# Patient Record
Sex: Male | Born: 1970 | Race: White | Hispanic: No | Marital: Married | Smoking: Former smoker
Health system: Southern US, Community
[De-identification: ages and names within clinical notes are randomized; demographics above are authoritative.]

## PROBLEM LIST (undated history)

## (undated) DIAGNOSIS — I10 Essential (primary) hypertension: Secondary | ICD-10-CM

---

## 2019-01-18 ENCOUNTER — Other Ambulatory Visit: Payer: Self-pay

## 2019-01-18 ENCOUNTER — Encounter: Payer: Self-pay | Admitting: Emergency Medicine

## 2019-01-18 ENCOUNTER — Emergency Department: Payer: No Typology Code available for payment source

## 2019-01-18 DIAGNOSIS — Z20828 Contact with and (suspected) exposure to other viral communicable diseases: Secondary | ICD-10-CM | POA: Diagnosis present

## 2019-01-18 DIAGNOSIS — K219 Gastro-esophageal reflux disease without esophagitis: Secondary | ICD-10-CM | POA: Diagnosis present

## 2019-01-18 DIAGNOSIS — I10 Essential (primary) hypertension: Secondary | ICD-10-CM | POA: Diagnosis present

## 2019-01-18 DIAGNOSIS — E871 Hypo-osmolality and hyponatremia: Secondary | ICD-10-CM | POA: Diagnosis present

## 2019-01-18 DIAGNOSIS — L309 Dermatitis, unspecified: Secondary | ICD-10-CM | POA: Diagnosis present

## 2019-01-18 DIAGNOSIS — E039 Hypothyroidism, unspecified: Secondary | ICD-10-CM | POA: Diagnosis present

## 2019-01-18 DIAGNOSIS — N179 Acute kidney failure, unspecified: Secondary | ICD-10-CM | POA: Diagnosis present

## 2019-01-18 DIAGNOSIS — F1010 Alcohol abuse, uncomplicated: Secondary | ICD-10-CM | POA: Diagnosis present

## 2019-01-18 DIAGNOSIS — L03114 Cellulitis of left upper limb: Secondary | ICD-10-CM | POA: Diagnosis not present

## 2019-01-18 DIAGNOSIS — Z79890 Hormone replacement therapy (postmenopausal): Secondary | ICD-10-CM

## 2019-01-18 DIAGNOSIS — B429 Sporotrichosis, unspecified: Secondary | ICD-10-CM | POA: Diagnosis not present

## 2019-01-18 DIAGNOSIS — Z87891 Personal history of nicotine dependence: Secondary | ICD-10-CM

## 2019-01-18 DIAGNOSIS — E878 Other disorders of electrolyte and fluid balance, not elsewhere classified: Secondary | ICD-10-CM | POA: Diagnosis present

## 2019-01-18 DIAGNOSIS — Z981 Arthrodesis status: Secondary | ICD-10-CM

## 2019-01-18 LAB — CBC WITH DIFFERENTIAL/PLATELET
Abs Immature Granulocytes: 0.02 10*3/uL (ref 0.00–0.07)
Basophils Absolute: 0.1 10*3/uL (ref 0.0–0.1)
Basophils Relative: 1 %
Eosinophils Absolute: 0.2 10*3/uL (ref 0.0–0.5)
Eosinophils Relative: 3 %
HCT: 43.8 % (ref 39.0–52.0)
Hemoglobin: 15.1 g/dL (ref 13.0–17.0)
Immature Granulocytes: 0 %
Lymphocytes Relative: 13 %
Lymphs Abs: 0.9 10*3/uL (ref 0.7–4.0)
MCH: 33.3 pg (ref 26.0–34.0)
MCHC: 34.5 g/dL (ref 30.0–36.0)
MCV: 96.5 fL (ref 80.0–100.0)
Monocytes Absolute: 0.6 10*3/uL (ref 0.1–1.0)
Monocytes Relative: 8 %
Neutro Abs: 5 10*3/uL (ref 1.7–7.7)
Neutrophils Relative %: 75 %
Platelets: 180 10*3/uL (ref 150–400)
RBC: 4.54 MIL/uL (ref 4.22–5.81)
RDW: 11.7 % (ref 11.5–15.5)
WBC: 6.8 10*3/uL (ref 4.0–10.5)
nRBC: 0 % (ref 0.0–0.2)

## 2019-01-18 LAB — COMPREHENSIVE METABOLIC PANEL
ALT: 54 U/L — ABNORMAL HIGH (ref 0–44)
AST: 35 U/L (ref 15–41)
Albumin: 4.2 g/dL (ref 3.5–5.0)
Alkaline Phosphatase: 38 U/L (ref 38–126)
Anion gap: 12 (ref 5–15)
BUN: 28 mg/dL — ABNORMAL HIGH (ref 6–20)
CO2: 21 mmol/L — ABNORMAL LOW (ref 22–32)
Calcium: 8.6 mg/dL — ABNORMAL LOW (ref 8.9–10.3)
Chloride: 96 mmol/L — ABNORMAL LOW (ref 98–111)
Creatinine, Ser: 1.47 mg/dL — ABNORMAL HIGH (ref 0.61–1.24)
GFR calc Af Amer: >60 mL/min (ref >60)
GFR calc non Af Amer: 55 mL/min — ABNORMAL LOW (ref >60)
Glucose, Bld: 95 mg/dL (ref 70–99)
Potassium: 4.3 mmol/L (ref 3.5–5.1)
Sodium: 129 mmol/L — ABNORMAL LOW (ref 135–145)
Total Bilirubin: 1.1 mg/dL (ref 0.3–1.2)
Total Protein: 7.1 g/dL (ref 6.5–8.1)

## 2019-01-18 LAB — LACTIC ACID, PLASMA: Lactic Acid, Venous: 1.2 mmol/L (ref 0.5–1.9)

## 2019-01-18 NOTE — ED Triage Notes (Signed)
Pt arrived via POV with reports of cutting trees down on Wednesday afternoon, states he got briars stuck in left hand, pt states over the past day he has noticed redness to left hand and open sores near left pinky and left thumb. Pt states pus was draining as well. Swelling also noted to left hand.

## 2019-01-19 ENCOUNTER — Inpatient Hospital Stay: Payer: No Typology Code available for payment source

## 2019-01-19 ENCOUNTER — Inpatient Hospital Stay
Admission: EM | Admit: 2019-01-19 | Discharge: 2019-01-20 | DRG: 603 | Disposition: A | Discharge status: Home / Self Care | Payer: No Typology Code available for payment source | Attending: Internal Medicine | Admitting: Internal Medicine

## 2019-01-19 DIAGNOSIS — E039 Hypothyroidism, unspecified: Secondary | ICD-10-CM | POA: Diagnosis present

## 2019-01-19 DIAGNOSIS — K219 Gastro-esophageal reflux disease without esophagitis: Secondary | ICD-10-CM

## 2019-01-19 DIAGNOSIS — Z79899 Other long term (current) drug therapy: Secondary | ICD-10-CM

## 2019-01-19 DIAGNOSIS — B4289 Other forms of sporotrichosis: Secondary | ICD-10-CM

## 2019-01-19 DIAGNOSIS — F1010 Alcohol abuse, uncomplicated: Secondary | ICD-10-CM

## 2019-01-19 DIAGNOSIS — B9561 Methicillin susceptible Staphylococcus aureus infection as the cause of diseases classified elsewhere: Secondary | ICD-10-CM | POA: Diagnosis not present

## 2019-01-19 DIAGNOSIS — Z87891 Personal history of nicotine dependence: Secondary | ICD-10-CM | POA: Diagnosis not present

## 2019-01-19 DIAGNOSIS — E878 Other disorders of electrolyte and fluid balance, not elsewhere classified: Secondary | ICD-10-CM | POA: Diagnosis present

## 2019-01-19 DIAGNOSIS — E871 Hypo-osmolality and hyponatremia: Secondary | ICD-10-CM | POA: Diagnosis present

## 2019-01-19 DIAGNOSIS — Z79890 Hormone replacement therapy (postmenopausal): Secondary | ICD-10-CM | POA: Diagnosis not present

## 2019-01-19 DIAGNOSIS — Z981 Arthrodesis status: Secondary | ICD-10-CM | POA: Diagnosis not present

## 2019-01-19 DIAGNOSIS — I10 Essential (primary) hypertension: Secondary | ICD-10-CM

## 2019-01-19 DIAGNOSIS — L03114 Cellulitis of left upper limb: Secondary | ICD-10-CM

## 2019-01-19 DIAGNOSIS — N179 Acute kidney failure, unspecified: Secondary | ICD-10-CM | POA: Diagnosis present

## 2019-01-19 DIAGNOSIS — B429 Sporotrichosis, unspecified: Secondary | ICD-10-CM

## 2019-01-19 DIAGNOSIS — L309 Dermatitis, unspecified: Secondary | ICD-10-CM | POA: Diagnosis present

## 2019-01-19 DIAGNOSIS — D72819 Decreased white blood cell count, unspecified: Secondary | ICD-10-CM | POA: Diagnosis not present

## 2019-01-19 DIAGNOSIS — Z20828 Contact with and (suspected) exposure to other viral communicable diseases: Secondary | ICD-10-CM | POA: Diagnosis present

## 2019-01-19 HISTORY — DX: Essential (primary) hypertension: I10

## 2019-01-19 LAB — SARS CORONAVIRUS 2 (TAT 6-24 HRS): SARS Coronavirus 2: NEGATIVE

## 2019-01-19 LAB — CBC
HCT: 41.6 % (ref 39.0–52.0)
Hemoglobin: 14.9 g/dL (ref 13.0–17.0)
MCH: 33.4 pg (ref 26.0–34.0)
MCHC: 35.8 g/dL (ref 30.0–36.0)
MCV: 93.3 fL (ref 80.0–100.0)
Platelets: 178 10*3/uL (ref 150–400)
RBC: 4.46 MIL/uL (ref 4.22–5.81)
RDW: 11.7 % (ref 11.5–15.5)
WBC: 5.5 10*3/uL (ref 4.0–10.5)
nRBC: 0 % (ref 0.0–0.2)

## 2019-01-19 LAB — BASIC METABOLIC PANEL
Anion gap: 9 (ref 5–15)
BUN: 24 mg/dL — ABNORMAL HIGH (ref 6–20)
CO2: 23 mmol/L (ref 22–32)
Calcium: 8.5 mg/dL — ABNORMAL LOW (ref 8.9–10.3)
Chloride: 105 mmol/L (ref 98–111)
Creatinine, Ser: 1.18 mg/dL (ref 0.61–1.24)
GFR calc Af Amer: >60 mL/min (ref >60)
GFR calc non Af Amer: >60 mL/min (ref >60)
Glucose, Bld: 119 mg/dL — ABNORMAL HIGH (ref 70–99)
Potassium: 3.9 mmol/L (ref 3.5–5.1)
Sodium: 137 mmol/L (ref 135–145)

## 2019-01-19 LAB — NA AND K (SODIUM & POTASSIUM), RAND UR
Potassium Urine: 10 mmol/L
Sodium, Ur: 27 mmol/L

## 2019-01-19 LAB — LACTIC ACID, PLASMA: Lactic Acid, Venous: 0.8 mmol/L (ref 0.5–1.9)

## 2019-01-19 LAB — HIV ANTIBODY (ROUTINE TESTING W REFLEX): HIV Screen 4th Generation wRfx: NONREACTIVE

## 2019-01-19 LAB — TSH: TSH: 3.435 u[IU]/mL (ref 0.350–4.500)

## 2019-01-19 LAB — OSMOLALITY: Osmolality: 293 mOsm/kg (ref 275–295)

## 2019-01-19 LAB — OSMOLALITY, URINE: Osmolality, Ur: 203 mOsm/kg — ABNORMAL LOW (ref 300–900)

## 2019-01-19 MED ORDER — VANCOMYCIN HCL IN DEXTROSE 750-5 MG/150ML-% IV SOLN
750.0000 mg | Freq: Two times a day (BID) | INTRAVENOUS | Status: DC
  Administered 2019-01-19 – 2019-01-20 (×2): 750 mg via INTRAVENOUS
  Filled 2019-01-19 (×3): qty 150

## 2019-01-19 MED ORDER — ONDANSETRON HCL 4 MG/2ML IJ SOLN: 4.0000 mg | Freq: Four times a day (QID) | INTRAMUSCULAR | Status: DC | PRN

## 2019-01-19 MED ORDER — POSACONAZOLE 100 MG PO TBEC
300.0000 mg | DELAYED_RELEASE_TABLET | Freq: Once | ORAL | Status: AC
  Administered 2019-01-19: 300 mg via ORAL
  Filled 2019-01-19: qty 3

## 2019-01-19 MED ORDER — VANCOMYCIN HCL IN DEXTROSE 1-5 GM/200ML-% IV SOLN
1000.0000 mg | Freq: Two times a day (BID) | INTRAVENOUS | Status: DC
  Filled 2019-01-19 (×2): qty 200

## 2019-01-19 MED ORDER — LEVOTHYROXINE SODIUM 50 MCG PO TABS
50.0000 ug | ORAL_TABLET | Freq: Every morning | ORAL | Status: DC
  Administered 2019-01-19 – 2019-01-20 (×2): 50 ug via ORAL
  Filled 2019-01-19 (×2): qty 1

## 2019-01-19 MED ORDER — VANCOMYCIN HCL 1.25 G IV SOLR
1250.0000 mg | Freq: Two times a day (BID) | INTRAVENOUS | Status: DC
  Filled 2019-01-19: qty 1250

## 2019-01-19 MED ORDER — LORAZEPAM 1 MG PO TABS: 1.0000 mg | ORAL_TABLET | Freq: Once | ORAL | Status: DC | PRN

## 2019-01-19 MED ORDER — SODIUM CHLORIDE 0.9 % IV SOLN
INTRAVENOUS | Status: DC
  Administered 2019-01-19 (×2): via INTRAVENOUS

## 2019-01-19 MED ORDER — SODIUM CHLORIDE 0.9 % IV SOLN
2.0000 g | Freq: Two times a day (BID) | INTRAVENOUS | Status: DC
  Administered 2019-01-19 – 2019-01-20 (×2): 2 g via INTRAVENOUS
  Filled 2019-01-19 (×3): qty 2

## 2019-01-19 MED ORDER — TERBINAFINE HCL 1 % EX CREA
TOPICAL_CREAM | Freq: Two times a day (BID) | CUTANEOUS | Status: DC
  Administered 2019-01-19: 09:00:00 via TOPICAL
  Filled 2019-01-19: qty 12

## 2019-01-19 MED ORDER — ONDANSETRON HCL 4 MG PO TABS: 4.0000 mg | ORAL_TABLET | Freq: Four times a day (QID) | ORAL | Status: DC | PRN

## 2019-01-19 MED ORDER — VANCOMYCIN HCL IN DEXTROSE 1-5 GM/200ML-% IV SOLN
1000.0000 mg | Freq: Once | INTRAVENOUS | Status: AC
  Administered 2019-01-19: 1000 mg via INTRAVENOUS
  Filled 2019-01-19: qty 200

## 2019-01-19 MED ORDER — SODIUM CHLORIDE 0.9 % IV BOLUS
1000.0000 mL | Freq: Once | INTRAVENOUS | Status: AC
  Administered 2019-01-19: 1000 mL via INTRAVENOUS

## 2019-01-19 MED ORDER — LABETALOL HCL 5 MG/ML IV SOLN
20.0000 mg | INTRAVENOUS | Status: DC | PRN
  Filled 2019-01-19: qty 4

## 2019-01-19 MED ORDER — ENOXAPARIN SODIUM 40 MG/0.4ML ~~LOC~~ SOLN
40.0000 mg | SUBCUTANEOUS | Status: DC
  Filled 2019-01-19 (×2): qty 0.4

## 2019-01-19 MED ORDER — VANCOMYCIN HCL IN DEXTROSE 1-5 GM/200ML-% IV SOLN: 1000.0000 mg | Freq: Once | INTRAVENOUS | Status: DC

## 2019-01-19 MED ORDER — MAGNESIUM HYDROXIDE 400 MG/5ML PO SUSP
30.0000 mL | Freq: Every day | ORAL | Status: DC | PRN
  Filled 2019-01-19: qty 30

## 2019-01-19 MED ORDER — ACETAMINOPHEN 650 MG RE SUPP: 650.0000 mg | Freq: Four times a day (QID) | RECTAL | Status: DC | PRN

## 2019-01-19 MED ORDER — ITRACONAZOLE 100 MG PO CAPS: 200.0000 mg | ORAL_CAPSULE | Freq: Once | ORAL | Status: DC

## 2019-01-19 MED ORDER — VANCOMYCIN HCL 1.25 G IV SOLR
1250.0000 mg | Freq: Once | INTRAVENOUS | Status: AC
  Administered 2019-01-19: 1250 mg via INTRAVENOUS
  Filled 2019-01-19: qty 1250

## 2019-01-19 MED ORDER — GADOBUTROL 1 MMOL/ML IV SOLN
10.0000 mL | Freq: Once | INTRAVENOUS | Status: AC | PRN
  Administered 2019-01-19: 10 mL via INTRAVENOUS

## 2019-01-19 MED ORDER — ACETAMINOPHEN 325 MG PO TABS: 650.0000 mg | ORAL_TABLET | Freq: Four times a day (QID) | ORAL | Status: DC | PRN

## 2019-01-19 MED ORDER — TRAZODONE HCL 50 MG PO TABS
25.0000 mg | ORAL_TABLET | Freq: Every evening | ORAL | Status: DC | PRN
  Administered 2019-01-19: 25 mg via ORAL
  Filled 2019-01-19: qty 1

## 2019-01-19 MED ORDER — FAMOTIDINE 20 MG PO TABS
20.0000 mg | ORAL_TABLET | Freq: Two times a day (BID) | ORAL | Status: DC | PRN
  Administered 2019-01-19 (×2): 20 mg via ORAL
  Filled 2019-01-19 (×2): qty 1

## 2019-01-19 MED ORDER — SODIUM CHLORIDE 0.9 % IV SOLN
1.0000 g | Freq: Once | INTRAVENOUS | Status: AC
  Administered 2019-01-19: 1 g via INTRAVENOUS
  Filled 2019-01-19: qty 1

## 2019-01-19 MED ORDER — POSACONAZOLE 100 MG PO TBEC
300.0000 mg | DELAYED_RELEASE_TABLET | Freq: Two times a day (BID) | ORAL | Status: DC
  Filled 2019-01-19 (×2): qty 3

## 2019-01-19 MED ORDER — DIPHENHYDRAMINE HCL 25 MG PO CAPS: 25.0000 mg | ORAL_CAPSULE | ORAL | Status: DC | PRN

## 2019-01-19 MED ORDER — LISINOPRIL 10 MG PO TABS: 20.0000 mg | ORAL_TABLET | Freq: Two times a day (BID) | ORAL | Status: DC

## 2019-01-19 NOTE — ED Provider Notes (Signed)
[facility] Emergency Department Provider Note  ____________________________________________  Time seen: Approximately 2:39 AM  I have reviewed the triage vital signs and the nursing notes.   HISTORY  Chief Complaint Hand Pain   HPI Kirk Landry is a 48 y.o. male with history of hypertension who presents for evaluation of hand infection.  Patient reports that 3 days ago he was cleaning out some trees and bushes with thorns in them. He got pricked on both hands several times.  Yesterday started noticing redness, warmth, pain, and swelling of his left hand.   Pain is mild.  No fever, no chills, no nausea, no vomiting.  Patient denies any history of immune suppression however he does report drinking 5 rum and coke drinks a day.  Patient is right-handed.  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  Past Medical History:  Diagnosis Date  . Hypertension      Prior to Admission medications   Medication Sig Start Date End Date Taking? Authorizing Provider  famotidine (PEPCID) 20 MG tablet Take 20 mg by mouth 2 (two) times daily as needed. 12/09/18  Yes [provider]  levothyroxine (SYNTHROID) 50 MCG tablet Take 50 mcg by mouth every morning. 12/09/18  Yes [provider]  lisinopril (ZESTRIL) 20 MG tablet Take 20 mg by mouth 2 (two) times daily. 12/09/18  Yes [provider]  sildenafil (VIAGRA) 100 MG tablet Take 50 mg by mouth daily as needed. 01/12/19  Yes [provider]    Allergies Patient has no known allergies.  No family history on file.  Social History Social History   Tobacco Use  . Smoking status: Former Games developer  . Smokeless tobacco: Never Used  Substance Use Topics  . Alcohol use: Yes  . Drug use: Not on file    Review of Systems  Constitutional: Negative for fever. Eyes: Negative for visual changes. ENT: Negative for sore throat. Neck: No neck pain  Cardiovascular: Negative for  chest pain. Respiratory: Negative for shortness of breath. Gastrointestinal: Negative for abdominal pain, vomiting or diarrhea. Genitourinary: Negative for dysuria. Musculoskeletal: Negative for back pain. + L hand pain and swelling Skin: Negative for rash. Neurological: Negative for headaches, weakness or numbness. Psych: No SI or HI  ____________________________________________   PHYSICAL EXAM:  VITAL SIGNS: ED Triage Vitals  Enc Vitals Group     BP 01/18/19 2224 (!) 133/91     Pulse Rate 01/18/19 2224 74     Resp 01/18/19 2224 18     Temp 01/18/19 2224 99 F (37.2 C)     Temp Source 01/18/19 2224 Oral     SpO2 01/18/19 2224 97 %     Weight 01/18/19 2221 260 lb (117.9 kg)     Height 01/18/19 2221 6\' 5"  (1.956 m)     Head Circumference --      Peak Flow --      Pain Score 01/18/19 2220 2     Pain Loc --      Pain Edu? --      Excl. in GC? --     Constitutional: Alert and oriented. Well appearing and in no apparent distress. HEENT:      Head: Normocephalic and atraumatic.         Eyes: Conjunctivae are normal. Sclera is non-icteric.       Mouth/Throat: Mucous membranes are moist.       Neck: Supple with no signs of meningismus. Cardiovascular: Regular  rate and rhythm.  Respiratory: Normal respiratory effort.  Musculoskeletal: Dorsum of the left hand is swollen, erythematous and warm to the touch.  Patient has 3 ulcerated lesions with surrounding erythema, no drainage or pus.  The redness extends past the wrist.  No palpable axillary lymphadenopathy.  Patient is able to fully close and open his hands with no significant pain.  There is no signs of inflammation of the palmar aspect of the hand Neurologic: Normal speech and language. Face is symmetric. Moving all extremities. No gross focal neurologic deficits are appreciated. Skin: Skin is warm, dry and intact. No rash noted. Psychiatric: Mood and affect are normal. Speech and behavior are normal.   ____________________________________________   LABS (all labs ordered are listed, but only abnormal results are displayed)  Labs Reviewed  COMPREHENSIVE METABOLIC PANEL - Abnormal; Notable for the following components:      Result Value   Sodium 129 (*)    Chloride 96 (*)    CO2 21 (*)    BUN 28 (*)    Creatinine, Ser 1.47 (*)    Calcium 8.6 (*)    ALT 54 (*)    GFR calc non Af Amer 55 (*)    All other components within normal limits  CULTURE, BLOOD (ROUTINE X 2)  CULTURE, BLOOD (ROUTINE X 2)  LACTIC ACID, PLASMA  CBC WITH DIFFERENTIAL/PLATELET  LACTIC ACID, PLASMA  HIV ANTIBODY (ROUTINE TESTING W REFLEX)   ____________________________________________  EKG  none  ____________________________________________  RADIOLOGY  I have personally reviewed the images performed during this visit and I agree with the Radiologist's read.   Interpretation by Radiologist:  Dg Hand Complete Left  Result Date: 01/19/2019 CLINICAL DATA:  Initial evaluation for possible infection. EXAM: LEFT HAND - COMPLETE 3+ VIEW COMPARISON:  None. FINDINGS: No acute fracture or dislocation. No radiographic evidence for acute osteomyelitis. Joint spaces maintained. Mild diffuse soft tissue swelling about the hand. No subcutaneous emphysema or radiopaque foreign body. IMPRESSION: 1. Mild diffuse soft tissue swelling about the hand. No soft tissue emphysema or radiopaque foreign body. 2. No acute osseous abnormality. No radiographic evidence for acute osteomyelitis. Electronically Signed   By: Rise MuBenjamin  McClintock M.D.   On: 01/19/2019 00:25     ____________________________________________   PROCEDURES  Procedure(s) performed: None Procedures Critical Care performed:  None ____________________________________________   INITIAL IMPRESSION / ASSESSMENT AND PLAN / ED COURSE   48 y.o. male with history of hypertension who presents for evaluation of hand infection after cutting several trees and bushes  with thorns several days ago.  Presentation is concerning for sporotrichosis plus or minus overlying cellulitis.  No signs of sepsis.  Patient's labs showing hyponatremia with a sodium of 129 and mild AKI with creatinine of 1.47 and GFR 55.  No prior recent labs for comparison.  Most likely due to alcohol abuse.  Discussed with pharmacy and will start patient on posaconazole.  We will also cover with IV antibiotics with cefepime and vancomycin.  Will give fluids for hyponatremia and AKI.  Will demarcate the affected area.  Will admit to the hospitalist service for close monitoring       As part of my medical decision making, I reviewed the following data within the electronic MEDICAL RECORD NUMBER Nursing notes reviewed and incorporated, Labs reviewed , Old chart reviewed, Radiograph reviewed , Discussed with admitting physician , Notes from prior ED visits and [place] Controlled Substance Database   Patient was evaluated in Emergency Department today for the symptoms described  in the history of present illness. Patient was evaluated in the context of the global COVID-19 pandemic, which necessitated consideration that the patient might be at risk for infection with the SARS-CoV-2 virus that causes COVID-19. Institutional protocols and algorithms that pertain to the evaluation of patients at risk for COVID-19 are in a state of rapid change based on information released by regulatory bodies including the CDC and federal and state organizations. These policies and algorithms were followed during the patient's care in the ED.   ____________________________________________   FINAL CLINICAL IMPRESSION(S) / ED DIAGNOSES   Final diagnoses:  Cutaneous sporotrichosis  Cellulitis of left upper extremity  Hyponatremia  AKI (acute kidney injury) ([place])      NEW MEDICATIONS STARTED DURING THIS VISIT:  ED Discharge Orders    None       Note:  This document was prepared using Dragon voice recognition  software and may include unintentional dictation errors.    Alfred Levins, [place], MD 01/19/19 [phone]

## 2019-01-19 NOTE — ED Notes (Signed)
Patient moved to RM 35, introduced self to patient.  Voices no needs at this time.

## 2019-01-19 NOTE — Progress Notes (Signed)
PROGRESS NOTE  Kirk Landry DZH:[id number] DOB: [birth year] DOA: 01/19/2019 PCP: Patient, No Pcp Per  Brief History   Kirk Landry  is a 48 y.o. Caucasian male with a known history of hypertension, hypothyroidism and alcohol abuse, who presented to the emergency room with acute onset of worsening left hand swelling with associated mild pain and warmth with no recent fever or chills.  He stated that he was cleaning and cutting a tree and moving branches as well as cleaning bushes that have thorns on Wednesday and got pricked on both hands several times.  His left hand started swelling with associated redness and pain since yesterday.  He denied any nausea or vomiting or abdominal pain.  No chest pain or dyspnea or cough or wheezing or hemoptysis.  He drinks about 5 alcoholic drinks per day and his last one was with lunch yesterday.  Upon presentation to the emergency room, blood pressure was 133/91 with a pulse of 74 and temperature was 99.  Labs revealed hyponatremia of 129 and hypochloremia of 96 with elevated BUN of 28 and creatinine 1.47 and slightly elevated ALT of 54 with AST 35, lactic acid 1.2 L 0.8 and CBC that was unremarkable.  The patient was given IV cefepime and IV vancomycin as well as posaconazole. Terbenafine was given topically to lesions.  He was admitted to a medical bed for further evaluation and management. This morning I have consulted Dr. Delaine Lame for infectious disease. He has recommended MRI of the hand to rule out tenosynovitis or osteomyelitis.  Antifungals have been stopped at his request as well.  Consultants  . Infectious Disease  Procedures  . None  Antibiotics   Anti-infectives (From admission, onward)   Start     Dose/Rate Route Frequency Ordered Stop   01/19/19 1800  vancomycin (VANCOCIN) 1,250 mg in sodium chloride 0.9 % 250 mL IVPB     1,250 mg 166.7 mL/hr over 90 Minutes Intravenous Every 12 hours 01/19/19 1226     01/19/19 1700  vancomycin  (VANCOCIN) IVPB 1000 mg/200 mL premix  Status:  Discontinued     1,000 mg 133.3 mL/hr over 90 Minutes Intravenous Every 12 hours 01/19/19 0511 01/19/19 1226   01/19/19 1400  posaconazole (NOXAFIL) delayed-release tablet 300 mg  Status:  Discontinued     300 mg Oral 2 times daily 01/19/19 0437 01/19/19 1232   01/19/19 0500  vancomycin (VANCOCIN) 1,250 mg in sodium chloride 0.9 % 250 mL IVPB     1,250 mg 166.7 mL/hr over 90 Minutes Intravenous  Once 01/19/19 0457 01/19/19 0716   01/19/19 0445  vancomycin (VANCOCIN) IVPB 1000 mg/200 mL premix  Status:  Discontinued     1,000 mg 200 mL/hr over 60 Minutes Intravenous  Once 01/19/19 0437 01/19/19 0457   01/19/19 0245  posaconazole (NOXAFIL) delayed-release tablet 300 mg     300 mg Oral  Once 01/19/19 0235 01/19/19 0549   01/19/19 0230  itraconazole (SPORANOX) capsule 200 mg  Status:  Discontinued     200 mg Oral  Once 01/19/19 0223 01/19/19 0235   01/19/19 0230  ceFEPIme (MAXIPIME) 1 g in sodium chloride 0.9 % 100 mL IVPB     1 g 200 mL/hr over 30 Minutes Intravenous  Once 01/19/19 0223 01/19/19 0436   01/19/19 0230  vancomycin (VANCOCIN) IVPB 1000 mg/200 mL premix     1,000 mg 200 mL/hr over 60 Minutes Intravenous  Once 01/19/19 0223 01/19/19 0543    .  Subjective  The patient is resting  comfortably. He states that his hand is much less swollen and that he can open and close his hand more easily.  Objective   Vitals:  Vitals:   01/19/19 0847 01/19/19 1219  BP: 132/79 120/80  Pulse: (!) 59 (!) 58  Resp: 16 20  Temp: 98.1 F (36.7 C) 98 F (36.7 C)  SpO2: 100% 98%   Exam:  Constitutional:  . The patient is awake, alert, and oriented x 3. No acute distress. Respiratory:  . No increased work of breathing. . No wheezes, rales, or rhonchi . No tactile fremitus Cardiovascular:  . Regular rate and rhythm . No murmurs, ectopy, or gallups. . No lateral PMI. No thrills. Abdomen:  . Abdomen is soft, non-tender, non-distended . No  hernias, masses, or organomegaly . Normoactive bowel sounds.  Musculoskeletal:  . No cyanosis, clubbing, or edema . Left hand is erythematous and slightly swollen. Lesions on left lateral aspect of hand and near 1st PCP joints are noted and have circumferential erythema. They appear dry. Skin:  . No rashes, lesions, ulcers with exception for left hand as described above. . palpation of skin: no induration or nodules Neurologic:  . CN 2-12 intact . Sensation all 4 extremities intact Psychiatric:  . Mental status o Mood, affect appropriate o Orientation to person, place, time  . judgment and insight appear intact  I have personally reviewed the following:   Today's Data  . Vitals, BMP, CBC  Micro Data  . Blood cultures x 2 have had no growth. .   Scheduled Meds: . enoxaparin (LOVENOX) injection  40 mg Subcutaneous Q24H  . levothyroxine  50 mcg Oral q morning - 10a  . terbinafine   Topical BID   Continuous Infusions: . sodium chloride 100 mL/hr at 01/19/19 0716  . vancomycin      Active Problems:   Cellulitis of left hand   LOS: 0 days   A & P   Left hand cellulitis with likely associated lesions highly suspicious for sporotrichosis:  The patient has been admitted to a medical bed. The patient has been continued on vancomycin. Antifungals have been discontinued on the advise of Dr. Rivka Safer. MRI of hand has been ordered to rule out abscess, osteomyelitis or tenosynovitis. Blood cultures x 2 have had no growth.  Acute kidney injury: Improved. Zestril has been held and the patient received IV fluids overnight. Continue to monitor creatinine, electrolytes and volume status. Avoid nephrotoxic substances and hypotension.  Hyponatremia:  Resolved. This likely secondary to excess alcohol intake. I will relax fluid restrictions.  Alcohol abuse:  He will be placed on as needed IV Ativan and a banana bag daily.  Hypothyroidism:  We will continue Synthroid and check TSH  level.  Hypertension: We will hold off Zestril given acute kidney injury and place him on as needed IV labetalol.  GERD.  We will continue H2 blocker therapy.  I have seen and examined this patient myself. I have spent 35 minutes in his evaluation and care with more than 50% of this spent in coordination of care with Dr. Rivka Safer.  Karuna Balducci, DO [facility] Direct contact: see [email or web address]  7PM-7AM contact night coverage as above 01/19/2019, 12:33 PM  LOS: 0 days

## 2019-01-19 NOTE — H&P (Signed)
[place] [facility]   PATIENT NAME: Kirk ReichertRonald Landry    MR#:  [id number]  DATE OF BIRTH:  [birth year]  DATE OF ADMISSION:  01/19/2019  PRIMARY CARE PHYSICIAN: Patient, No Pcp Per   REQUESTING/REFERRING PHYSICIAN: Cecil CobbsVeronese, Caroline, MD  CHIEF COMPLAINT:   Chief Complaint  Patient presents with  . Hand Pain    HISTORY OF PRESENT ILLNESS:  Kirk ReichertRonald Ahles  is a 48 y.o. Caucasian male with a known history of hypertension, hypothyroidism and alcohol abuse, who presented to the emergency room with acute onset of worsening left hand swelling with associated mild pain and warmth with no recent fever or chills.  He stated that he was cleaning and cutting a tree and moving branches as well as cleaning bushes that have thorns on Wednesday and got pricked on both hands several times.  His left hand started swelling with associated redness and pain since yesterday.  He denied any nausea or vomiting or abdominal pain.  No chest pain or dyspnea or cough or wheezing or hemoptysis.  He drinks about 5 alcoholic drinks per day and his last one was with lunch yesterday.  Upon presentation to the emergency room, blood pressure was 133/91 with a pulse of 74 and temperature was 99.  Labs revealed hyponatremia of 129 and hypochloremia of 96 with elevated BUN of 28 and creatinine 1.47 and slightly elevated ALT of 54 with AST 35, lactic acid 1.2 L 0.8 and CBC that was unremarkable.  The patient was given IV cefepime and IV vancomycin as well as posaconazole.  He will be admitted to a medical bed for further evaluation and management  PAST MEDICAL HISTORY:   Past Medical History:  Diagnosis Date  . Hypertension     PAST SURGICAL HISTORY:  History reviewed. No pertinent surgical history.  SOCIAL HISTORY:   Social History   Tobacco Use  . Smoking status: Former Games developermoker  . Smokeless tobacco: Never Used  Substance Use Topics  . Alcohol use: Yes    FAMILY HISTORY:  No family history on  file.  DRUG ALLERGIES:  No Known Allergies  REVIEW OF SYSTEMS:   ROS As per history of present illness. All pertinent systems were reviewed above. Constitutional,  HEENT, cardiovascular, respiratory, GI, GU, musculoskeletal, neuro, psychiatric, endocrine,  integumentary and hematologic systems were reviewed and are otherwise  negative/unremarkable except for positive findings mentioned above in the HPI.   MEDICATIONS AT HOME:   Prior to Admission medications   Medication Sig Start Date End Date Taking? Authorizing Provider  famotidine (PEPCID) 20 MG tablet Take 20 mg by mouth 2 (two) times daily as needed. 12/09/18  Yes [provider]  levothyroxine (SYNTHROID) 50 MCG tablet Take 50 mcg by mouth every morning. 12/09/18  Yes [provider]  lisinopril (ZESTRIL) 20 MG tablet Take 20 mg by mouth 2 (two) times daily. 12/09/18  Yes [provider]  sildenafil (VIAGRA) 100 MG tablet Take 50 mg by mouth daily as needed. 01/12/19  Yes [provider]      VITAL SIGNS:  Blood pressure 121/77, pulse 63, temperature 99 F (37.2 C), temperature source Oral, resp. rate 18, height 6\' 5"  (1.956 m), weight 117.9 kg, SpO2 96 %.  PHYSICAL EXAMINATION:  Physical Exam  GENERAL:  48 y.o.-year-old well-developed well-nourished Caucasian male patient lying in the bed with no acute distress.  EYES: Pupils equal, round, reactive to light and accommodation. No scleral icterus. Extraocular muscles intact.  HEENT: Head atraumatic, normocephalic. Oropharynx  and nasopharynx clear.  NECK:  Supple, no jugular venous distention. No thyroid enlargement, no tenderness.  LUNGS: Normal breath sounds bilaterally, no wheezing, rales,rhonchi or crepitation. No use of accessory muscles of respiration.  CARDIOVASCULAR: Regular rate and rhythm, S1, S2 normal. No murmurs, rubs, or gallops.  ABDOMEN: Soft, nondistended, nontender. Bowel sounds present. No organomegaly or mass.   EXTREMITIES: No pedal edema, cyanosis, or clubbing.  NEUROLOGIC: Cranial nerves II through XII are intact. Muscle strength 5/5 in all extremities. Sensation intact. Gait not checked.  PSYCHIATRIC: The patient is alert and oriented x 3.  Normal affect and good eye contact. SKIN: Left dorsal hand swelling with erythema, warmth, mild induration and tenderness with oval eruptions/lesion with central hyperpigmentation however rather than hypopigmentation seen with ringworm and raised erythematous surrounding edge on the lateral side of the dorsum of the hand almost at the fifth carpometacarpal joint as well as dorsal side of the base of this thumb at the metacarpal phalangeal joint and a smaller one at the base of the index proximal to the second metacarpophalangeal joint.  Erythema is slightly extending to his left wrist and distal forearm    LABORATORY PANEL:   CBC Recent Labs  Lab 01/19/19 0542  WBC 5.5  HGB 14.9  HCT 41.6  PLT 178   ------------------------------------------------------------------------------------------------------------------  Chemistries  Recent Labs  Lab 01/18/19 2228  NA 129*  K 4.3  CL 96*  CO2 21*  GLUCOSE 95  BUN 28*  CREATININE 1.47*  CALCIUM 8.6*  AST 35  ALT 54*  ALKPHOS 38  BILITOT 1.1   ------------------------------------------------------------------------------------------------------------------  Cardiac Enzymes No results for input(s): TROPONINI in the last 168 hours. ------------------------------------------------------------------------------------------------------------------  RADIOLOGY:  Dg Hand Complete Left  Result Date: 01/19/2019 CLINICAL DATA:  Initial evaluation for possible infection. EXAM: LEFT HAND - COMPLETE 3+ VIEW COMPARISON:  None. FINDINGS: No acute fracture or dislocation. No radiographic evidence for acute osteomyelitis. Joint spaces maintained. Mild diffuse soft tissue swelling about the hand. No subcutaneous  emphysema or radiopaque foreign body. IMPRESSION: 1. Mild diffuse soft tissue swelling about the hand. No soft tissue emphysema or radiopaque foreign body. 2. No acute osseous abnormality. No radiographic evidence for acute osteomyelitis. Electronically Signed   By: Jeannine Boga M.D.   On: 01/19/2019 00:25      IMPRESSION AND PLAN:   1.  Left hand cellulitis with likely associated lesions highly suspicious for sporotrichosis.  The patient will be admitted to a medical bed.  We will continue antibiotic therapy with IV vancomycin given the severity of his cellulitis as well as continue p.o. posaconazole and add Lamisil cream to his lesions to cover fungal etiology associated with his lesions.  2.  Acute kidney injury.  We will hold off Zestril and hydrate with IV banana bag and follow BMP.  3.  Hyponatremia.  This likely secondary to excess alcohol intake.  He will be hydrated with IV normal saline and will limit p.o. fluids to 1200 mL/day.  Will check hyponatremia work-up.  4.  Alcohol abuse.  He will be placed on as needed IV Ativan and a banana bag daily.  5.  Hypothyroidism.  We will continue Synthroid and check TSH level.  6.  Hypertension.  We will hold off Zestril given acute kidney injury and place him on as needed IV labetalol.  7.  GERD.  We will continue H2 blocker therapy.  8.  DVT prophylaxis.  Subcutaneous Lovenox.  All the records are reviewed and case discussed with ED provider.  The plan of care was discussed in details with the patient (and family). I answered all questions. The patient agreed to proceed with the above mentioned plan. Further management will depend upon hospital course.   CODE STATUS: Full code  TOTAL TIME TAKING CARE OF THIS PATIENT: 55 minutes.    Hannah Beat M.D on 01/19/2019 at 6:20 AM  [facility]   From 7 PM-7 AM, contact night-coverage [email or web address]  CC: Primary care physician; Patient, No Pcp Per   Note: This dictation  was prepared with Dragon dictation along with smaller phrase technology. Any transcriptional errors that result from this process are unintentional.

## 2019-01-19 NOTE — ED Notes (Signed)
See triage note. Pt in with swelling and circular discolored spot to L hand after cutting down bamboo trees with briars. Thinks maybe he got one of the briars stuck in his hand.

## 2019-01-19 NOTE — Consult Note (Signed)
NAME: Kirk Landry  DOB: November 20, 1970  MRN: [id number]  Date/Time: 01/19/2019 12:39 PM  REQUESTING PROVIDER Dr. Benny Lennert Subjective:  REASON FOR CONSULT: Left hand infection ? Kirk Landry is a 48 y.o. male with a history of hypertension presented to the emergency department on 01/18/2019 with swelling of his left hand and redness and 2 areas that appear like a open wound. Patient does a lot of outdoor activities.  On Wednesday, 01/15/2019 he was cutting trees and was pulling some briars down when he got the prior stuck in his left hand.  There were 2 places where he thought the  needle went in. one was base of the thumb and the other was at the lateral margin of his left hand.  He took a needle from a sewing kit box and try to open up both the areas to remove the splinter.  The next day he noted that the area was getting a blister which was filled with blood.  He tried to squeeze it to open it.  He then used hydrogen peroxide and some neomycin and hydrocortisone.  Yesterday he started to notice that his hand was more swollen and red and his wife was concerned that he may have necrotizing fasciitis and he came to the emergency department.  He did not have any fever or chills.  He noticed that the swelling was going up his arm.  He did not have much pain.  He says his pain threshold is very high. Patient prior to that had injured his left index finger knuckle area from fishing and as per his wife it was swollen but the patient says it was not.  This was 3 weeks ago.  Then on October 30 he went hunting and killed a hog and skinned deep wound at the hog  He says he is always getting scratches and injuries from all his activities  In the ED his vitals where 99 of temperature respiratory rate of 18, pulse rate of 74 blood pressure of 133/91.  He got a dose of vancomycin and cefepime and also got a dose of posaconazole 300 mg tablet.   Past Medical History:  Diagnosis Date  . 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  Drinks rum  Social History   Socioeconomic History  . Marital status: Married    Spouse name: Not on file  . Number of children: Not on file  . Years of education: Not on file  . Highest education level: Not on file  Occupational History  . Not on file  Social Needs  . Financial resource strain: Not on file  . Food insecurity    Worry: Not on file    Inability: Not on file  . Transportation needs    Medical: Not on file    Non-medical: Not on file  Tobacco Use  . Smoking status: Former Research scientist (life sciences)  . Smokeless tobacco: Never Used  Substance and Sexual Activity  . Alcohol use: Yes  . Drug use: Not on file  . Sexual activity: Not on file  Lifestyle  . Physical activity    Days per week: Not on file    Minutes per session: Not on file  . Stress: Not on file  Relationships  . Social connections  Talks on phone: Not on file    Gets together: Not on file    Attends religious service: Not on file    Active member of club or organization: Not on file    Attends meetings of clubs or organizations: Not on file    Relationship status: Not on file  . Intimate partner violence    Fear of current or ex partner: Not on file    Emotionally abused: Not on file    Physically abused: Not on file    Forced sexual activity: Not on file  Other Topics Concern  . Not on file  Social History Narrative  . Not on file    FH- cancer father ? Current Facility-Administered Medications  Medication Dose Route Frequency Provider Last Rate Last Dose  . 0.9 %  sodium chloride infusion   Intravenous Continuous Mansy, Jan A, MD 100 mL/hr at 01/19/19 0716    . acetaminophen (TYLENOL) tablet 650 mg  650 mg Oral Q6H PRN Mansy, Jan A, MD       Or   . acetaminophen (TYLENOL) suppository 650 mg  650 mg Rectal Q6H PRN Mansy, Jan A, MD      . diphenhydrAMINE (BENADRYL) capsule 25 mg  25 mg Oral PRN Swayze, Ava, DO      . enoxaparin (LOVENOX) injection 40 mg  40 mg Subcutaneous Q24H Mansy, Jan A, MD      . famotidine (PEPCID) tablet 20 mg  20 mg Oral BID PRN Mansy, Jan A, MD   20 mg at 01/19/19 1204  . labetalol (NORMODYNE) injection 20 mg  20 mg Intravenous Q3H PRN Mansy, Jan A, MD      . levothyroxine (SYNTHROID) tablet 50 mcg  50 mcg Oral q morning - 10a Mansy, Jan A, MD   50 mcg at 01/19/19 0916  . LORazepam (ATIVAN) tablet 1 mg  1 mg Oral Once PRN Swayze, Ava, DO      . magnesium hydroxide (MILK OF MAGNESIA) suspension 30 mL  30 mL Oral Daily PRN Mansy, Jan A, MD      . ondansetron [facility]) tablet 4 mg  4 mg Oral Q6H PRN Mansy, Jan A, MD       Or  . ondansetron [facility]) injection 4 mg  4 mg Intravenous Q6H PRN Mansy, Jan A, MD      . traZODone (DESYREL) tablet 25 mg  25 mg Oral QHS PRN Mansy, Jan A, MD      . vancomycin (VANCOCIN) 1,250 mg in sodium chloride 0.9 % 250 mL IVPB  1,250 mg Intravenous Q12H Hallaji, Sheema M, RPH         Abtx:  Anti-infectives (From admission, onward)   Start     Dose/Rate Route Frequency Ordered Stop   01/19/19 1800  vancomycin (VANCOCIN) 1,250 mg in sodium chloride 0.9 % 250 mL IVPB     1,250 mg 166.7 mL/hr over 90 Minutes Intravenous Every 12 hours 01/19/19 1226     01/19/19 1700  vancomycin (VANCOCIN) IVPB 1000 mg/200 mL premix  Status:  Discontinued     1,000 mg 133.3 mL/hr over 90 Minutes Intravenous Every 12 hours 01/19/19 0511 01/19/19 1226   01/19/19 1400  posaconazole (NOXAFIL) delayed-release tablet 300 mg  Status:  Discontinued     300 mg Oral 2 times daily 01/19/19 0437 01/19/19 1232   01/19/19 0500  vancomycin (VANCOCIN) 1,250 mg in sodium chloride 0.9 % 250 mL IVPB     1,250 mg 166.7 mL/hr over 90  Minutes Intravenous  Once 01/19/19 0457 01/19/19 0716   01/19/19 0445  vancomycin  (VANCOCIN) IVPB 1000 mg/200 mL premix  Status:  Discontinued     1,000 mg 200 mL/hr over 60 Minutes Intravenous  Once 01/19/19 0437 01/19/19 0457   01/19/19 0245  posaconazole (NOXAFIL) delayed-release tablet 300 mg     300 mg Oral  Once 01/19/19 0235 01/19/19 0549   01/19/19 0230  itraconazole (SPORANOX) capsule 200 mg  Status:  Discontinued     200 mg Oral  Once 01/19/19 0223 01/19/19 0235   01/19/19 0230  ceFEPIme (MAXIPIME) 1 g in sodium chloride 0.9 % 100 mL IVPB     1 g 200 mL/hr over 30 Minutes Intravenous  Once 01/19/19 0223 01/19/19 0436   01/19/19 0230  vancomycin (VANCOCIN) IVPB 1000 mg/200 mL premix     1,000 mg 200 mL/hr over 60 Minutes Intravenous  Once 01/19/19 0223 01/19/19 0543      REVIEW OF SYSTEMS:  Const: negative fever, negative chills, negative weight loss Eyes: negative diplopia or visual changes, negative eye pain ENT: negative coryza, negative sore throat Resp: negative cough, hemoptysis, dyspnea Cards: negative for chest pain, palpitations, lower extremity edema GU: negative for frequency, dysuria and hematuria GI: Negative for abdominal pain, diarrhea, bleeding, constipation Skin: As above heme: negative for easy bruising and gum/nose bleeding MS: negative for myalgias, arthralgias, back pain and muscle weakness Neurolo:negative for headaches, dizziness, vertigo, memory problems  Psych: negative for feelings of anxiety, depression  Endocrine: negative for thyroid, diabetes Allergy/Immunology- negative for any medication or food allergies Objective:  VITALS:  BP 120/80   Pulse (!) 58   Temp 98 F (36.7 C) (Oral)   Resp 20   Ht 6' 5"  (1.956 m)   Wt 117.9 kg   SpO2 98%   BMI 30.83 kg/m  PHYSICAL EXAM:  General: Alert, cooperative, no distress, appears stated age.  Head: Normocephalic, without obvious abnormality, atraumatic. Eyes: Conjunctivae clear, anicteric sclerae. Pupils are equal ENT Nares normal. No drainage or sinus tenderness. Lips,  mucosa, and tongue normal. No Thrush Neck: Supple, symmetrical, no adenopathy, thyroid: non tender no carotid bruit and no JVD. Back: No CVA tenderness. Lungs: Clear to auscultation bilaterally. No Wheezing or Rhonchi. No rales. Heart: Regular rate and rhythm, no murmur, rub or gallop. Abdomen: Soft, non-tender,not distended. Bowel sounds normal. No masses Extremities: Left hand is swollen There are 2 areas of discoid lesions which has got a central necrotic spot with surrounding erythema t this looks like a hemorrhagic blister which has flattened  No lymphangitis No nodular lesions          01/18/19      skin: No rashes or lesions. Or bruising Lymph: Cervical, supraclavicular normal. Neurologic: Grossly non-focal Pertinent Labs Lab Results CBC    Component Value Date/Time   WBC 5.5 01/19/2019 0542   RBC 4.46 01/19/2019 0542   HGB 14.9 01/19/2019 0542   HCT 41.6 01/19/2019 0542   PLT 178 01/19/2019 0542   MCV 93.3 01/19/2019 0542   MCH 33.4 01/19/2019 0542   MCHC 35.8 01/19/2019 0542   RDW 11.7 01/19/2019 0542   LYMPHSABS 0.9 01/18/2019 2228   MONOABS 0.6 01/18/2019 2228   EOSABS 0.2 01/18/2019 2228   BASOSABS 0.1 01/18/2019 2228    CMP Latest Ref Rng & Units 01/19/2019 01/18/2019  Glucose 70 - 99 mg/dL 119(H) 95  BUN 6 - 20 mg/dL 24(H) 28(H)  Creatinine 0.61 - 1.24 mg/dL 1.18 1.47(H)  Sodium 135 -  145 mmol/L 137 129(L)  Potassium 3.5 - 5.1 mmol/L 3.9 4.3  Chloride 98 - 111 mmol/L 105 96(L)  CO2 22 - 32 mmol/L 23 21(L)  Calcium 8.9 - 10.3 mg/dL 8.5(L) 8.6(L)  Total Protein 6.5 - 8.1 g/dL - 7.1  Total Bilirubin 0.3 - 1.2 mg/dL - 1.1  Alkaline Phos 38 - 126 U/L - 38  AST 15 - 41 U/L - 35  ALT 0 - 44 U/L - 54(H)      Microbiology: Recent Results (from the past 240 hour(s))  Blood culture (routine x 2)     Status: None (Preliminary result)   Collection Time: 01/19/19  2:59 AM   Specimen: BLOOD  Result Value Ref Range Status   Specimen Description  BLOOD LEFT ANTECUBITAL  Final   Special Requests   Final    BOTTLES DRAWN AEROBIC AND ANAEROBIC Blood Culture results may not be optimal due to an excessive volume of blood received in culture bottles   Culture   Final    NO GROWTH < 12 HOURS Performed at [facility], [address] [place], [place] [address]    Report Status PENDING  Incomplete  Blood culture (routine x 2)     Status: None (Preliminary result)   Collection Time: 01/19/19  2:59 AM   Specimen: BLOOD  Result Value Ref Range Status   Specimen Description BLOOD RIGHT HAND  Final   Special Requests   Final    BOTTLES DRAWN AEROBIC AND ANAEROBIC Blood Culture adequate volume   Culture   Final    NO GROWTH < 12 HOURS Performed at [facility], [address] [place], [place] [address]    Report Status PENDING  Incomplete    IMAGING RESULTS: I have personally reviewed the films ? Impression/Recommendation ?48 year old male presenting with left hand swelling and 2 discrete areas of discoid erythematous lesions following briars sticking in his skin? ? Cellulitis of his left hand with 2 areas of discrete infection in a discoid pattern.  This looks like an hemorrhagic blister that has flattened.  This is an acute presentation and happened 48 hours after sustaining the injury.  He also manipulated the wound with a needle.  He is also had prior to this exposures and injuries due to fishing and recently he had hunted [place] skin that.  Need to think of bacteria that are skin which is staph or strep and in the vegetation which could be Pseudomonas or Aeromonas and unusual organisms including fungus and atypical mycobacteria. Also nocardia is in the mix. This is not in a lymphatic distribution and dates are all not nodules.  And also being acute sporotrichosis is less likely. He ideally needs culture.  Will recommend imaging and also hand surgeon consult.  May need punch biopsy to sent for fungal and atypical  mycobacteria as well as bacterial culture.  He has received a dose of vancomycin and cefepime and a dose of posaconazole.  We will DC the latter.  On continue the antibiotics.  Patient says he is improved since he received antibiotics and that makes me think this could be a bacterial infection.  Hyponatremia and low urine osmolality.  etoh related Hypertension was on ACE inhibitor.  This is on hold because of AKI.  AKI much better  Discussed the management with the patient and his wife and Dr. Benny Lennert.  Thank you for the opportunity to be part of the patient's care team.  Will follow along. ___________________________________________________ Discussed with patient, requesting  provider Note:  This document was prepared using Dragon voice recognition software and may include unintentional dictation errors.

## 2019-01-19 NOTE — Progress Notes (Signed)
Pharmacy Antibiotic Note  Kirk Landry is a 48 y.o. male admitted on 01/19/2019 with cellulitis.  Pharmacy has been consulted for vancomcyin dosing.  Vancomycin 2250 mg IV load 11/8am   Scr improved 1.47>> 1.18.   Plan: Will adjust dose to  Vancomycin 750 mg IV Q 12 hrs. Goal AUC 400-550. Expected AUC: 430.7 SCr used: 1.18 Cssmin: 13.1 Vd 0.5.   Will continue to monitor renal function and will adjust as needed.  Height: 6\' 5"  (195.6 cm) Weight: 260 lb (117.9 kg) IBW/kg (Calculated) : 89.1  Temp (24hrs), Avg:98.4 F (36.9 C), Min:98 F (36.7 C), Max:99 F (37.2 C)  Recent Labs  Lab 01/18/19 2228 01/19/19 0259 01/19/19 0542  WBC 6.8  --  5.5  CREATININE 1.47*  --  1.18  LATICACIDVEN 1.2 0.8  --     Estimated Creatinine Clearance: 107.8 mL/min (by C-G formula based on SCr of 1.18 mg/dL).    No Known Allergies  Thank you for allowing pharmacy to be a part of this patient's care.  Oswald Hillock, PharmD, BCPS Clinical Pharmacist 01/19/2019 1:37 PM

## 2019-01-19 NOTE — Progress Notes (Signed)
Pharmacy Antibiotic Note  Kirk Landry is a 48 y.o. male admitted on 01/19/2019 with cellulitis.  Pharmacy has been consulted for vancomcyin dosing.  Vancomycin 2250 mg IV load 11/8am   Scr improved 1.47>> 1.18.   Plan: Will adjust dose to  Vancomycin 1250 mg IV Q 12 hrs. Goal AUC 400-550. Expected AUC: 507 SCr used: 1.18 Cssmin: 13.9  Will continue to monitor renal function and will adjust as needed.  Height: 6\' 5"  (195.6 cm) Weight: 260 lb (117.9 kg) IBW/kg (Calculated) : 89.1  Temp (24hrs), Avg:98.4 F (36.9 C), Min:98 F (36.7 C), Max:99 F (37.2 C)  Recent Labs  Lab 01/18/19 2228 01/19/19 0259 01/19/19 0542  WBC 6.8  --  5.5  CREATININE 1.47*  --  1.18  LATICACIDVEN 1.2 0.8  --     Estimated Creatinine Clearance: 107.8 mL/min (by C-G formula based on SCr of 1.18 mg/dL).    No Known Allergies  Thank you for allowing pharmacy to be a part of this patient's care.  Pernell Dupre, PharmD, BCPS Clinical Pharmacist 01/19/2019 12:28 PM

## 2019-01-19 NOTE — Progress Notes (Signed)
Pharmacy Antibiotic Note  Kirk Landry is a 48 y.o. male admitted on 01/19/2019 with cellulitis.  Pharmacy has been consulted for vancomcyin dosing.  Plan: Patient received vanc 1g IV x 1 in ED at 0430, will give another vanc 1.25g IV x 1 to complete a vanc 2.25g IV load. Patient's Scr is currently 1.47 mg/dL no h/o CKD and no other values to compare so will start:  Vancomycin 1000 mg IV Q 12 hrs. Goal AUC 400-550. Expected AUC: 499.0 SCr used: 1.47 Cssmin: 14.4  Will continue to monitor renal function and will adjust as needed.  Height: 6\' 5"  (195.6 cm) Weight: 260 lb (117.9 kg) IBW/kg (Calculated) : 89.1  Temp (24hrs), Avg:99 F (37.2 C), Min:99 F (37.2 C), Max:99 F (37.2 C)  Recent Labs  Lab 01/18/19 2228 01/19/19 0259  WBC 6.8  --   CREATININE 1.47*  --   LATICACIDVEN 1.2 0.8    Estimated Creatinine Clearance: 86.5 mL/min (A) (by C-G formula based on SCr of 1.47 mg/dL (H)).    No Known Allergies  Thank you for allowing pharmacy to be a part of this patient's care.  Tobie Lords, PharmD, BCPS Clinical Pharmacist 01/19/2019 5:00 AM

## 2019-01-20 ENCOUNTER — Other Ambulatory Visit: Payer: Self-pay | Admitting: Infectious Diseases

## 2019-01-20 DIAGNOSIS — D72819 Decreased white blood cell count, unspecified: Secondary | ICD-10-CM

## 2019-01-20 DIAGNOSIS — B9561 Methicillin susceptible Staphylococcus aureus infection as the cause of diseases classified elsewhere: Secondary | ICD-10-CM

## 2019-01-20 LAB — CBC
HCT: 40.1 % (ref 39.0–52.0)
HCT: 41 % (ref 39.0–52.0)
Hemoglobin: 14.3 g/dL (ref 13.0–17.0)
Hemoglobin: 14.2 g/dL (ref 13.0–17.0)
MCH: 33.3 pg (ref 26.0–34.0)
MCH: 33 pg (ref 26.0–34.0)
MCHC: 35.7 g/dL (ref 30.0–36.0)
MCHC: 34.6 g/dL (ref 30.0–36.0)
MCV: 93.5 fL (ref 80.0–100.0)
MCV: 95.3 fL (ref 80.0–100.0)
Platelets: 159 10*3/uL (ref 150–400)
Platelets: 175 10*3/uL (ref 150–400)
RBC: 4.29 MIL/uL (ref 4.22–5.81)
RBC: 4.3 MIL/uL (ref 4.22–5.81)
RDW: 11.5 % (ref 11.5–15.5)
RDW: 11.9 % (ref 11.5–15.5)
WBC: 3.7 10*3/uL — ABNORMAL LOW (ref 4.0–10.5)
WBC: 3.7 10*3/uL — ABNORMAL LOW (ref 4.0–10.5)
nRBC: 0 % (ref 0.0–0.2)
nRBC: 0 % (ref 0.0–0.2)

## 2019-01-20 LAB — DIFFERENTIAL
Abs Immature Granulocytes: 0.01 10*3/uL (ref 0.00–0.07)
Basophils Absolute: 0.1 10*3/uL (ref 0.0–0.1)
Basophils Relative: 2 %
Eosinophils Absolute: 0.2 10*3/uL (ref 0.0–0.5)
Eosinophils Relative: 7 %
Immature Granulocytes: 0 %
Lymphocytes Relative: 23 %
Lymphs Abs: 0.9 10*3/uL (ref 0.7–4.0)
Monocytes Absolute: 0.4 10*3/uL (ref 0.1–1.0)
Monocytes Relative: 11 %
Neutro Abs: 2.1 10*3/uL (ref 1.7–7.7)
Neutrophils Relative %: 57 %

## 2019-01-20 LAB — BASIC METABOLIC PANEL
Anion gap: 9 (ref 5–15)
BUN: 22 mg/dL — ABNORMAL HIGH (ref 6–20)
CO2: 23 mmol/L (ref 22–32)
Calcium: 8.7 mg/dL — ABNORMAL LOW (ref 8.9–10.3)
Chloride: 107 mmol/L (ref 98–111)
Creatinine, Ser: 1.37 mg/dL — ABNORMAL HIGH (ref 0.61–1.24)
GFR calc Af Amer: >60 mL/min (ref >60)
GFR calc non Af Amer: >60 mL/min (ref >60)
Glucose, Bld: 100 mg/dL — ABNORMAL HIGH (ref 70–99)
Potassium: 4 mmol/L (ref 3.5–5.1)
Sodium: 139 mmol/L (ref 135–145)

## 2019-01-20 MED ORDER — DOXYCYCLINE HYCLATE 50 MG PO CAPS: 100.0000 mg | ORAL_CAPSULE | Freq: Two times a day (BID) | ORAL | 0 refills | Status: AC

## 2019-01-20 MED ORDER — VANCOMYCIN HCL IN DEXTROSE 1-5 GM/200ML-% IV SOLN
1000.0000 mg | Freq: Two times a day (BID) | INTRAVENOUS | Status: DC
  Filled 2019-01-20: qty 200

## 2019-01-20 MED ORDER — SULFAMETHOXAZOLE-TRIMETHOPRIM 800-160 MG PO TABS: 1.0000 | ORAL_TABLET | Freq: Two times a day (BID) | ORAL | 0 refills | Status: DC

## 2019-01-20 MED ORDER — ACETAMINOPHEN 325 MG PO TABS: 650.0000 mg | ORAL_TABLET | Freq: Four times a day (QID) | ORAL | 0 refills | Status: DC | PRN

## 2019-01-20 NOTE — Progress Notes (Signed)
Kirk Landry to be D/C'd home per MD order.  Discussed prescriptions and follow up appointments with the patient. Prescriptions given to patient, medication list explained in detail. Pt verbalized understanding.  Allergies as of 01/20/2019   No Known Allergies     Medication List    TAKE these medications   acetaminophen 325 MG tablet Commonly known as: TYLENOL Take 2 tablets (650 mg total) by mouth every 6 (six) hours as needed for mild pain (or Fever >/= 101).   famotidine 20 MG tablet Commonly known as: PEPCID Take 20 mg by mouth 2 (two) times daily as needed.   levothyroxine 50 MCG tablet Commonly known as: SYNTHROID Take 50 mcg by mouth every morning.   lisinopril 20 MG tablet Commonly known as: ZESTRIL Take 20 mg by mouth 2 (two) times daily.   sildenafil 100 MG tablet Commonly known as: VIAGRA Take 50 mg by mouth daily as needed.   sulfamethoxazole-trimethoprim 800-160 MG tablet Commonly known as: BACTRIM DS Take 1 tablet by mouth 2 (two) times daily for 7 days.       Vitals:   01/20/19 0520 01/20/19 1232  BP: 104/74 127/81  Pulse: (!) 53 (!) 54  Resp: 20 20  Temp: 97.8 F (36.6 C) 97.9 F (36.6 C)  SpO2: 98% 97%    Skin clean, dry and intact without evidence of skin break down, no evidence of skin tears noted. IV catheter discontinued intact. Site without signs and symptoms of complications. Dressing and pressure applied. Pt denies pain at this time. No complaints noted.  An After Visit Summary was printed and given to the patient. Patient escorted via [place], and D/C home via private auto.  Chuck Hint RN [facility] [place] [employer]

## 2019-01-20 NOTE — Progress Notes (Signed)
Date of Admission:  01/19/2019     ID: Kirk Landry is a 48 y.o. male  Active Problems:   Cellulitis of left hand    Subjective: Doing much better Swelling of the left hand much improved The 2 wounds have dried up as well  Medications:   enoxaparin (LOVENOX) injection  40 mg Subcutaneous Q24H   levothyroxine  50 mcg Oral q morning - 10a    Objective: Vital signs in last 24 hours: Temp:  [97.8 F (36.6 C)-98.2 F (36.8 C)] 97.8 F (36.6 C) (11/09 0520) Pulse Rate:  [53-58] 53 (11/09 0520) Resp:  [20] 20 (11/09 0520) BP: (104-132)/(73-80) 104/74 (11/09 0520) SpO2:  [98 %] 98 % (11/09 0520)  PHYSICAL EXAM:  General: Alert, cooperative, no distress, appears stated age.   Extremities: left Hand- no swelling, no erythem 2 lesions on the hand          atraumatic, no cyanosis. No edema. No clubbing Skin: No rashes or lesions. Or bruising Lymph: Cervical, supraclavicular normal. Neurologic: Grossly non-focal  Lab Results Recent Labs    01/19/19 0542 01/20/19 0453  WBC 5.5 3.7*  HGB 14.9 14.3  HCT 41.6 40.1  NA 137 139  K 3.9 4.0  CL 105 107  CO2 23 23  BUN 24* 22*  CREATININE 1.18 1.37*   Liver Panel Recent Labs    01/18/19 2228  PROT 7.1  ALBUMIN 4.2  AST 35  ALT 54*  ALKPHOS 38  BILITOT 1.1   Sedimentation Rate No results for input(s): ESRSEDRATE in the last 72 hours. C-Reactive Protein No results for input(s): CRP in the last 72 hours.  Microbiology: WC- staph aureus Studies/Results: Mr Hand Left W Wo Contrast  Result Date: 01/19/2019 CLINICAL DATA:  Hand swelling and cellulitis, question osteomyelitis EXAM: MRI OF THE LEFT HAND WITHOUT AND WITH CONTRAST TECHNIQUE: Multiplanar, multisequence MR imaging of the left was performed before and after the administration of intravenous contrast. CONTRAST:  45mL GADAVIST GADOBUTROL 1 MMOL/ML IV SOLN COMPARISON:  Radiograph same day FINDINGS: Bones/Joint/Cartilage Cystic changes are seen  within the scaphoid capitate, lunate, and inferior pole of the triquetrum. No areas of bony destruction, marrow edema pathologic marrow infiltration. No areas of abnormal enhancement. No joint effusions are seen. The joint spaces appear to be well maintained. Ligaments The intrinsic of the hand are intact. Muscles and Tendons The muscles are normal appearance without evidence of edema or atrophy. The flexor and extensor tendons are intact. Soft tissues There is diffuse dorsal subcutaneous edema and skin thickening. No enhancing loculated fluid collection however is seen. No sinus tract or large area of ulceration. IMPRESSION: 1. Findings suggestive dorsal soft tissue cellulitis. No abscess or sinus tract. 2. No evidence osteomyelitis. 3. Cystic degenerative changes seen within the carpals as described above. Electronically Signed   By: Prudencio Pair M.D.   On: 01/19/2019 20:13   Dg Hand Complete Left  Result Date: 01/19/2019 CLINICAL DATA:  Initial evaluation for possible infection. EXAM: LEFT HAND - COMPLETE 3+ VIEW COMPARISON:  None. FINDINGS: No acute fracture or dislocation. No radiographic evidence for acute osteomyelitis. Joint spaces maintained. Mild diffuse soft tissue swelling about the hand. No subcutaneous emphysema or radiopaque foreign body. IMPRESSION: 1. Mild diffuse soft tissue swelling about the hand. No soft tissue emphysema or radiopaque foreign body. 2. No acute osseous abnormality. No radiographic evidence for acute osteomyelitis. Electronically Signed   By: Jeannine Boga M.D.   On: 01/19/2019 00:25     Assessment/Plan:  48 year old male presenting with left hand swelling and 2 discrete areas of discoid erythematous lesions following briars sticking in his skin? ? Cellulitis of his left hand with 2 areas of discrete infection in a discoid pattern.  This looks like an hemorrhagic blister that has flattened.  This is an acute presentation and happened 48 hours after sustaining  the injury.  He also manipulated the wound with a needle.  He is also had prior to this exposures and injuries due to fishing and recently he had hunted [place]. Need to think of bacteria that are skin which is staph or strep and in the vegetation which could be Pseudomonas or Aeromonas and unusual organisms including fungus and atypical mycobacteria.  culture sent from the wound is staph aureus- susceptibility pending-   MRI does not show any tendon/bone involvement  As patient is much improved can DC IV and send him on PO bactrim DS 1 BID for 7 days. I will contact him with final culture report  Leucopenia- WBC trending down- could be related to vanco? could be it is his baseline from ETOH . Will check CBC with diff on wednesday  Hyponatremia resolved.  etoh related Hypertension was on ACE inhibitor.  This is on hold because of AKI.   Discussed the management with the patient and his wife and Dr. Gerri Lins and Dr.Miller ortho Will follow up in 1 week

## 2019-01-20 NOTE — Consult Note (Signed)
ORTHOPAEDIC CONSULTATION  REQUESTING PHYSICIAN: Swayze, Ava, DO  Chief Complaint: Infection left hand  HPI: Kirk Landry is a 48 y.o. male who complains of infection left hand dorsally at the base of the thumb and ulnarly at the base of the fifth metacarpal.  On Wednesday, 01/15/2019 he was clearing brush and got some thorns in his hand.  He attempted to remove these with a needle on his own.  He developed redness and swelling which got severe enough to go to our emergency room yesterday 01/19/2019.  He denied any real fever or chills.  Examined at time revealed two circular lesions in the above-named areas.  There was some swelling and redness in the hand apparently.  Initial temperature was 99 degrees but he has been afebrile   since antibiotics were started.  His white blood counts have been normal.  He denies any significant pain today.  He says swelling and redness is decreased significantly.  MRI showed some cellulitis but no deep infection.  Cultures of the skin shows a few staph cultures.  Other cultures are pending.  He is a retired Emergency planning/management officer.  He is very active with his hands and gets cuts and abrasions frequently.  Past Medical History:  Diagnosis Date  . Hypertension    History reviewed. No pertinent surgical history. Social History   Socioeconomic History  . Marital status: Married    Spouse name: Not on file  . Number of children: Not on file  . Years of education: Not on file  . Highest education level: Not on file  Occupational History  . Not on file  Social Needs  . Financial resource strain: Not on file  . Food insecurity    Worry: Not on file    Inability: Not on file  . Transportation needs    Medical: Not on file    Non-medical: Not on file  Tobacco Use  . Smoking status: Former Games developer  . Smokeless tobacco: Never Used  Substance and Sexual Activity  . Alcohol use: Yes  . Drug use: Not on file  . Sexual activity: Not on file  Lifestyle  . Physical  activity    Days per week: Not on file    Minutes per session: Not on file  . Stress: Not on file  Relationships  . Social Musician on phone: Not on file    Gets together: Not on file    Attends religious service: Not on file    Active member of club or organization: Not on file    Attends meetings of clubs or organizations: Not on file    Relationship status: Not on file  Other Topics Concern  . Not on file  Social History Narrative  . Not on file   History reviewed. No pertinent family history. No Known Allergies Prior to Admission medications   Medication Sig Start Date End Date Taking? Authorizing Provider  famotidine (PEPCID) 20 MG tablet Take 20 mg by mouth 2 (two) times daily as needed. 12/09/18  Yes [provider]  levothyroxine (SYNTHROID) 50 MCG tablet Take 50 mcg by mouth every morning. 12/09/18  Yes [provider]  lisinopril (ZESTRIL) 20 MG tablet Take 20 mg by mouth 2 (two) times daily. 12/09/18  Yes [provider]  sildenafil (VIAGRA) 100 MG tablet Take 50 mg by mouth daily as needed. 01/12/19  Yes [provider]   Mr Hand Left W Wo Contrast  Result Date: 01/19/2019 CLINICAL DATA:  Hand swelling and cellulitis, question osteomyelitis EXAM: MRI OF THE LEFT HAND WITHOUT AND WITH CONTRAST TECHNIQUE: Multiplanar, multisequence MR imaging of the left was performed before and after the administration of intravenous contrast. CONTRAST:  44mL GADAVIST GADOBUTROL 1 MMOL/ML IV SOLN COMPARISON:  Radiograph same day FINDINGS: Bones/Joint/Cartilage Cystic changes are seen within the scaphoid capitate, lunate, and inferior pole of the triquetrum. No areas of bony destruction, marrow edema pathologic marrow infiltration. No areas of abnormal enhancement. No joint effusions are seen. The joint spaces appear to be well maintained. Ligaments The intrinsic of the hand are intact. Muscles and Tendons The muscles are normal appearance without  evidence of edema or atrophy. The flexor and extensor tendons are intact. Soft tissues There is diffuse dorsal subcutaneous edema and skin thickening. No enhancing loculated fluid collection however is seen. No sinus tract or large area of ulceration. IMPRESSION: 1. Findings suggestive dorsal soft tissue cellulitis. No abscess or sinus tract. 2. No evidence osteomyelitis. 3. Cystic degenerative changes seen within the carpals as described above. Electronically Signed   By: Prudencio Pair M.D.   On: 01/19/2019 20:13   Dg Hand Complete Left  Result Date: 01/19/2019 CLINICAL DATA:  Initial evaluation for possible infection. EXAM: LEFT HAND - COMPLETE 3+ VIEW COMPARISON:  None. FINDINGS: No acute fracture or dislocation. No radiographic evidence for acute osteomyelitis. Joint spaces maintained. Mild diffuse soft tissue swelling about the hand. No subcutaneous emphysema or radiopaque foreign body. IMPRESSION: 1. Mild diffuse soft tissue swelling about the hand. No soft tissue emphysema or radiopaque foreign body. 2. No acute osseous abnormality. No radiographic evidence for acute osteomyelitis. Electronically Signed   By: Jeannine Boga M.D.   On: 01/19/2019 00:25    Positive ROS: All other systems have been reviewed and were otherwise negative with the exception of those mentioned in the HPI and as above.  Physical Exam: General: Alert, no acute distress Cardiovascular: No pedal edema Respiratory: No cyanosis, no use of accessory musculature GI: No organomegaly, abdomen is soft and non-tender Skin: No lesions in the area of chief complaint Neurologic: Sensation intact distally Psychiatric: Patient is competent for consent with normal mood and affect Lymphatic: No axillary or cervical lymphadenopathy  MUSCULOSKELETAL: Patient alert cooperative.  His wife is at the bedside.  Left hand shows 2 circular lesions 1 at the base of the left thumb dorsally and the second 1 on the ulnar side of the hand at  the base of the fifth metacarpal.  Knees are flat and dry now.  No cellulitis ascending.  No pus apparent.  There is full range of motion fingers and wrist.  There is no local tenderness.    Assessment: Resolving cellulitis/infection left hand  Plan: Patient appears to be responding well to the antibiotic regimen.  He may be discharged home on p.o. antibiotics per medical service when they are comfortable with this.  Dr. Steva Ready and I discussed this and she will follow up with him in 1 week.      Park Breed, MD [phone]   01/20/2019 12:24 PM

## 2019-01-20 NOTE — Progress Notes (Signed)
Pharmacy Antibiotic Note  Kirk Landry is a 48 y.o. male admitted on 01/19/2019 with cellulitis.  Pharmacy has been consulted for vancomcyin dosing.  Vancomycin 2250 mg IV load 11/8am   Scr1.47>> 1.18 >> 1.37   Plan: Will adjust dose to  Vancomycin 1000 mg IV Q 12 hrs. Goal AUC 400-550. Expected AUC: 491 SCr used: 1.37 Cssmin: 14.1 Vd 0.5.   Will continue to monitor renal function and will adjust as needed.  Height: 6\' 5"  (195.6 cm) Weight: 260 lb (117.9 kg) IBW/kg (Calculated) : 89.1  Temp (24hrs), Avg:98 F (36.7 C), Min:97.8 F (36.6 C), Max:98.2 F (36.8 C)  Recent Labs  Lab 01/18/19 2228 01/19/19 0259 01/19/19 0542 01/20/19 0453  WBC 6.8  --  5.5 3.7*  CREATININE 1.47*  --  1.18 1.37*  LATICACIDVEN 1.2 0.8  --   --     Estimated Creatinine Clearance: 92.8 mL/min (A) (by C-G formula based on SCr of 1.37 mg/dL (H)).    No Known Allergies  Thank you for allowing pharmacy to be a part of this patient's care.  Pernell Dupre, PharmD, BCPS Clinical Pharmacist 01/20/2019 8:25 AM

## 2019-01-21 LAB — AEROBIC CULTURE W GRAM STAIN (SUPERFICIAL SPECIMEN): Gram Stain: NONE SEEN

## 2019-01-21 NOTE — Discharge Summary (Signed)
Physician Discharge Summary  Kirk ReichertRonald Lemmerman ZOX:[id number] DOB: 1970-07-19 DOA: 01/19/2019  PCP: Patient, No Pcp Per  Admit date: 01/19/2019 Discharge date: 01/21/2019  Recommendations for Outpatient Follow-up:  1. Follow up with PCP in 7-10 days. 2. Follow up with Dr. Rivka Saferavishankar in one week. 3. CBC to be drawn on 01/22/2019 and reported to Dr. Rivka Saferavishankar.  Follow-up Information    [facility] at [facility]. Call in 1 week.   Specialty: Family Medicine Why: will call for follow up Contact information: [address] [place] [id number] [phone]       Lynn Itoavishankar, Jayashree, MD In 1 week.   Specialty: Infectious Diseases Why: will call with appointment  Contact information: [address] [place] [id number] [phone]          Discharge Diagnoses: Principal diagnosis is #1 1. Left hand cellulitis 2. Dermatitis of the left hand 3. AKI 4. Hyponatremia 5. Alcohol abuse 6. Hypothyroidism 7. GERD  Discharge Condition: Fair  Disposition: Home  Diet recommendation: Heart healthy  Filed Weights   01/18/19 2221  Weight: 117.9 kg    History of present illness:  Kirk Landry  is a 48 y.o. Caucasian male with a known history of hypertension, hypothyroidism and alcohol abuse, who presented to the emergency room with acute onset of worsening left hand swelling with associated mild pain and warmth with no recent fever or chills.  He stated that he was cleaning and cutting a tree and moving branches as well as cleaning bushes that have thorns on Wednesday and got pricked on both hands several times.  His left hand started swelling with associated redness and pain since yesterday.  He denied any nausea or vomiting or abdominal pain.  No chest pain or dyspnea or cough or wheezing or hemoptysis.  He drinks about 5 alcoholic drinks per day and his last one was with lunch yesterday.  Upon presentation to the emergency room, blood pressure was 133/91  with a pulse of 74 and temperature was 99.  Labs revealed hyponatremia of 129 and hypochloremia of 96 with elevated BUN of 28 and creatinine 1.47 and slightly elevated ALT of 54 with AST 35, lactic acid 1.2 L 0.8 and CBC that was unremarkable.  The patient was given IV cefepime and IV vancomycin as well as posaconazole.  He will be admitted to a medical bed for further evaluation and management  Hospital Course:  The patient was admitted. He was continued on IV antibiotics. Infectious disease was consulted. MRI was obtained that demonstrated no evidence of tenosynovitis, abscess, or osteomyelitis. Orthopedic surgery evaluated the patient's hand. The patient was discharged to home on oral doxycycline as recommended by infectious disease. The patient will follow up with Dr. Rivka Saferavishankar in 10 days.  Today's assessment: S: The patient is resting comfortably. No new complaints.  O: Vitals:  Vitals:   01/20/19 0520 01/20/19 1232  BP: 104/74 127/81  Pulse: (!) 53 (!) 54  Resp: 20 20  Temp: 97.8 F (36.6 C) 97.9 F (36.6 C)  SpO2: 98% 97%  Constitutional:   The patient is awake, alert, and oriented x 3. No acute distress. Respiratory:   No increased work of breathing.  No wheezes, rales, or rhonchi  No tactile fremitus Cardiovascular:   Regular rate and rhythm  No murmurs, ectopy, or gallups.  No lateral PMI. No thrills. Abdomen:   Abdomen is soft, non-tender, non-distended  No hernias, masses, or organomegaly  Normoactive bowel sounds.  Musculoskeletal:   No cyanosis, clubbing, or  edema  Left hand is much less erythematous and swollen. Lesions on left lateral aspect of hand and near 1st PCP joints are noted and have circumferential erythema. They appear dry. Skin:   No rashes, lesions, ulcers with exception for left hand as described above.  palpation of skin: no induration or nodules Neurologic:   CN 2-12 intact  Sensation all 4 extremities intact Psychiatric:     Mental status ? Mood, affect appropriate ? Orientation to person, place, time  judgment and insight appear intact   Discharge Instructions  Discharge Instructions    Activity as tolerated - No restrictions   Complete by: As directed    Call MD for:  redness, tenderness, or signs of infection (pain, swelling, redness, odor or green/yellow discharge around incision site)   Complete by: As directed    Call MD for:  severe uncontrolled pain   Complete by: As directed    Call MD for:  temperature >100.4   Complete by: As directed    Diet - low sodium heart healthy   Complete by: As directed    Discharge instructions   Complete by: As directed    Follow up with PCP in 7-10 days. Follow up with Dr. Rivka Safer in one week. CBC to be drawn on 01/22/2019 and reported to Dr. Rivka Safer.   Increase activity slowly   Complete by: As directed      Allergies as of 01/20/2019   No Known Allergies     Medication List    TAKE these medications   acetaminophen 325 MG tablet Commonly known as: TYLENOL Take 2 tablets (650 mg total) by mouth every 6 (six) hours as needed for mild pain (or Fever >/= 101).   doxycycline 50 MG capsule Commonly known as: VIBRAMYCIN Take 2 capsules (100 mg total) by mouth 2 (two) times daily for 7 days.   famotidine 20 MG tablet Commonly known as: PEPCID Take 20 mg by mouth 2 (two) times daily as needed.   levothyroxine 50 MCG tablet Commonly known as: SYNTHROID Take 50 mcg by mouth every morning.   lisinopril 20 MG tablet Commonly known as: ZESTRIL Take 20 mg by mouth 2 (two) times daily.   sildenafil 100 MG tablet Commonly known as: VIAGRA Take 50 mg by mouth daily as needed.      No Known Allergies  The results of significant diagnostics from this hospitalization (including imaging, microbiology, ancillary and laboratory) are listed below for reference.    Significant Diagnostic Studies: Mr Hand Left W Wo Contrast  Result Date:  01/19/2019 CLINICAL DATA:  Hand swelling and cellulitis, question osteomyelitis EXAM: MRI OF THE LEFT HAND WITHOUT AND WITH CONTRAST TECHNIQUE: Multiplanar, multisequence MR imaging of the left was performed before and after the administration of intravenous contrast. CONTRAST:  10mL GADAVIST GADOBUTROL 1 MMOL/ML IV SOLN COMPARISON:  Radiograph same day FINDINGS: Bones/Joint/Cartilage Cystic changes are seen within the scaphoid capitate, lunate, and inferior pole of the triquetrum. No areas of bony destruction, marrow edema pathologic marrow infiltration. No areas of abnormal enhancement. No joint effusions are seen. The joint spaces appear to be well maintained. Ligaments The intrinsic of the hand are intact. Muscles and Tendons The muscles are normal appearance without evidence of edema or atrophy. The flexor and extensor tendons are intact. Soft tissues There is diffuse dorsal subcutaneous edema and skin thickening. No enhancing loculated fluid collection however is seen. No sinus tract or large area of ulceration. IMPRESSION: 1. Findings suggestive dorsal soft tissue cellulitis. No  abscess or sinus tract. 2. No evidence osteomyelitis. 3. Cystic degenerative changes seen within the carpals as described above. Electronically Signed   By: Prudencio Pair M.D.   On: 01/19/2019 20:13   Dg Hand Complete Left  Result Date: 01/19/2019 CLINICAL DATA:  Initial evaluation for possible infection. EXAM: LEFT HAND - COMPLETE 3+ VIEW COMPARISON:  None. FINDINGS: No acute fracture or dislocation. No radiographic evidence for acute osteomyelitis. Joint spaces maintained. Mild diffuse soft tissue swelling about the hand. No subcutaneous emphysema or radiopaque foreign body. IMPRESSION: 1. Mild diffuse soft tissue swelling about the hand. No soft tissue emphysema or radiopaque foreign body. 2. No acute osseous abnormality. No radiographic evidence for acute osteomyelitis. Electronically Signed   By: Jeannine Boga M.D.    On: 01/19/2019 00:25    Microbiology: Recent Results (from the past 240 hour(s))  Blood culture (routine x 2)     Status: None (Preliminary result)   Collection Time: 01/19/19  2:59 AM   Specimen: BLOOD  Result Value Ref Range Status   Specimen Description BLOOD LEFT ANTECUBITAL  Final   Special Requests   Final    BOTTLES DRAWN AEROBIC AND ANAEROBIC Blood Culture results may not be optimal due to an excessive volume of blood received in culture bottles   Culture   Final    NO GROWTH 2 DAYS Performed at [facility], [address], [place], [place] [address]    Report Status PENDING  Incomplete  Blood culture (routine x 2)     Status: None (Preliminary result)   Collection Time: 01/19/19  2:59 AM   Specimen: BLOOD  Result Value Ref Range Status   Specimen Description BLOOD RIGHT HAND  Final   Special Requests   Final    BOTTLES DRAWN AEROBIC AND ANAEROBIC Blood Culture adequate volume   Culture   Final    NO GROWTH 2 DAYS Performed at [facility], [address], [place], [place] [address]    Report Status PENDING  Incomplete  SARS CORONAVIRUS 2 (TAT 6-24 HRS) Nasopharyngeal Nasopharyngeal Swab     Status: None   Collection Time: 01/19/19  3:49 AM   Specimen: Nasopharyngeal Swab  Result Value Ref Range Status   SARS Coronavirus 2 NEGATIVE NEGATIVE Final    Comment: (NOTE) SARS-CoV-2 target nucleic acids are NOT DETECTED. The SARS-CoV-2 RNA is generally detectable in upper and lower respiratory specimens during the acute phase of infection. Negative results do not preclude SARS-CoV-2 infection, do not rule out co-infections with other pathogens, and should not be used as the sole basis for treatment or other patient management decisions. Negative results must be combined with clinical observations, patient history, and epidemiological information. The expected result is Negative. Fact Sheet for Patients: SugarRoll.be Fact  Sheet for Healthcare Providers: https://www.woods-mathews.com/ This test is not yet approved or cleared by the [place] FDA and  has been authorized for detection and/or diagnosis of SARS-CoV-2 by FDA under an Emergency Use Authorization (EUA). This EUA will remain  in effect (meaning this test can be used) for the duration of the COVID-19 declaration under Section 56 4(b)(1) of the Act, 21 U.S.C. section 360bbb-3(b)(1), unless the authorization is terminated or revoked sooner. Performed at [facility], [place] [address], [place], [place] [address]   Aerobic Culture (superficial specimen)     Status: None   Collection Time: 01/19/19  3:32 PM   Specimen: Joint, Finger; Wound  Result Value Ref Range Status   Specimen Description  Final    FINGER LEFT Performed at [facility], [address], [place], [place] [address]    Special Requests   Final    NONE Performed at [facility], [address] [place], [place] [address]    Gram Stain   Final    NO WBC SEEN NO ORGANISMS SEEN Performed at [facility], [address] [place], [place] [address]    Culture FEW STAPHYLOCOCCUS AUREUS  Final   Report Status 01/21/2019 FINAL  Final   Organism ID, Bacteria STAPHYLOCOCCUS AUREUS  Final      Susceptibility   Staphylococcus aureus - MIC*    CIPROFLOXACIN <=0.5 SENSITIVE Sensitive     ERYTHROMYCIN <=0.25 SENSITIVE Sensitive     GENTAMICIN <=0.5 SENSITIVE Sensitive     OXACILLIN 0.5 SENSITIVE Sensitive     TETRACYCLINE <=1 SENSITIVE Sensitive     VANCOMYCIN <=0.5 SENSITIVE Sensitive     TRIMETH/SULFA <=10 SENSITIVE Sensitive     CLINDAMYCIN <=0.25 SENSITIVE Sensitive     RIFAMPIN <=0.5 SENSITIVE Sensitive     Inducible Clindamycin NEGATIVE Sensitive     * FEW STAPHYLOCOCCUS AUREUS  Culture, fungus without smear     Status: None (Preliminary result)   Collection Time: 01/19/19  3:32 PM   Specimen: Wound; Other  Result Value Ref Range  Status   Specimen Description   Final    WOUND Performed at [facility], [address] [place], [place] [address]    Special Requests   Final    NONE Performed at [facility], [address], [place], [place] [address]    Culture   Final    NO FUNGUS ISOLATED AFTER 1 DAY Performed at [facility], [address], [place], [place] [address]    Report Status PENDING  Incomplete     Labs: Basic Metabolic Panel: Recent Labs  Lab 01/18/19 2228 01/19/19 0542 01/20/19 0453  NA 129* 137 139  K 4.3 3.9 4.0  CL 96* 105 107  CO2 21* 23 23  GLUCOSE 95 119* 100*  BUN 28* 24* 22*  CREATININE 1.47* 1.18 1.37*  CALCIUM 8.6* 8.5* 8.7*   Liver Function Tests: Recent Labs  Lab 01/18/19 2228  AST 35  ALT 54*  ALKPHOS 38  BILITOT 1.1  PROT 7.1  ALBUMIN 4.2   No results for input(s): LIPASE, AMYLASE in the last 168 hours. No results for input(s): AMMONIA in the last 168 hours. CBC: Recent Labs  Lab 01/18/19 2228 01/19/19 0542 01/20/19 0453  WBC 6.8 5.5 3.7*   3.7*  NEUTROABS 5.0  --  2.1  HGB 15.1 14.9 14.2   14.3  HCT 43.8 41.6 41.0   40.1  MCV 96.5 93.3 95.3   93.5  PLT 180 178 175   159   Cardiac Enzymes: No results for input(s): CKTOTAL, CKMB, CKMBINDEX, TROPONINI in the last 168 hours. BNP: BNP (last 3 results) No results for input(s): BNP in the last 8760 hours.  ProBNP (last 3 results) No results for input(s): PROBNP in the last 8760 hours.  CBG: No results for input(s): GLUCAP in the last 168 hours.  Active Problems:   Cellulitis of left hand   Time coordinating discharge: 38 minutes.  Signed:        Jaylun Fleener, DO [facility]  01/21/2019, 8:59 PM

## 2019-01-24 ENCOUNTER — Telehealth: Payer: Self-pay | Admitting: Infectious Diseases

## 2019-01-24 LAB — CULTURE, BLOOD (ROUTINE X 2)
Culture: NO GROWTH
Culture: NO GROWTH
Special Requests: ADEQUATE

## 2019-01-24 NOTE — Telephone Encounter (Signed)
Called patient yesterday and told him the culture was MSSA- He is currently on bactrim DS and doing well- says the lesion on his hand are healing well- He will be getting labs at [facility] -his [place] office

## 2019-01-28 ENCOUNTER — Other Ambulatory Visit: Payer: Self-pay | Admitting: Infectious Diseases

## 2019-02-11 LAB — CULTURE, FUNGUS WITHOUT SMEAR

## 2019-10-02 LAB — COLOGUARD: COLOGUARD: NEGATIVE

## 2020-05-20 IMAGING — MR MR [PERSON_NAME]*[PERSON_NAME]* WO/W CM
5 series · 40 of 40 positions shown · IV contrast (gadavist)
Comparison: Radiograph same day

CLINICAL DATA: Hand swelling and cellulitis, question osteomyelitis

EXAM:
MRI OF THE LEFT HAND WITHOUT AND WITH CONTRAST
TECHNIQUE: Multiplanar, multisequence MR imaging of the left was performed
before and after the administration of intravenous contrast.
CONTRAST:  10mL GADAVIST GADOBUTROL 1 MMOL/ML IV SOLN

[Series 4: T2 · axial · left · 4.0mm · 0.62mm/px · z∈[-109,+61]mm · 11 of 39 slices shown]
[im 1/39]
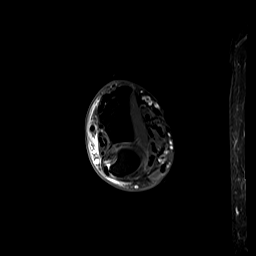
[im 4/39]
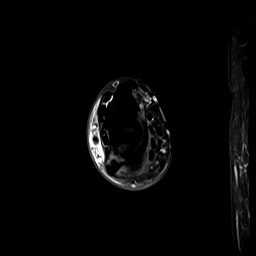
[im 8/39]
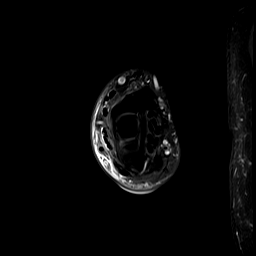
[im 12/39]
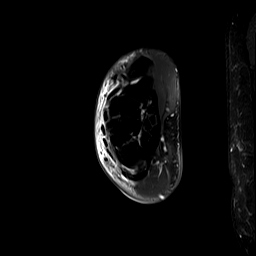
[im 16/39]
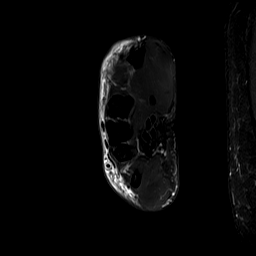
[im 20/39]
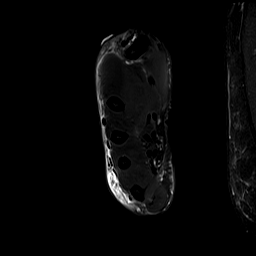
[im 23/39]
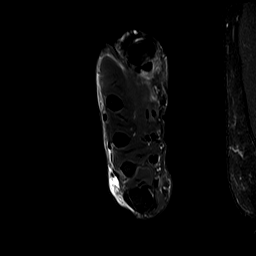
[im 27/39]
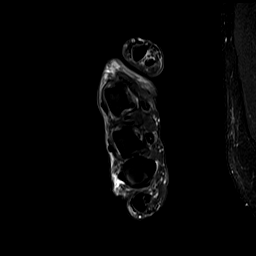
[im 31/39]
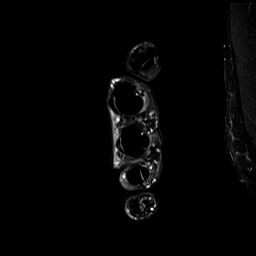
[im 35/39]
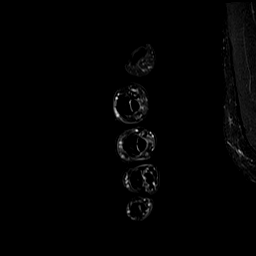
[im 39/39]
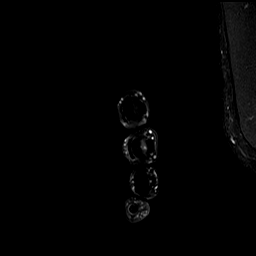

[Series 6: STIR · sagittal · left · 3.0mm · 0.50mm/px · 6 of 21 slices shown]
[im 1/21]
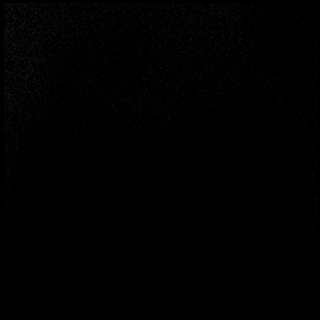
[im 5/21]
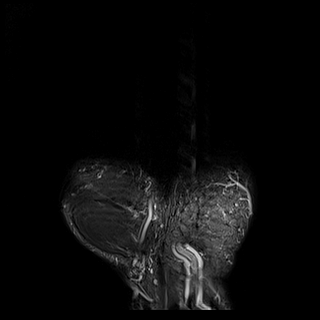
[im 9/21]
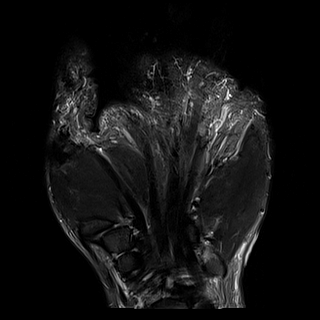
[im 13/21]
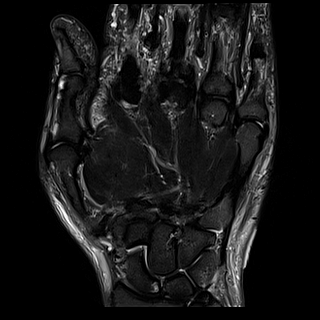
[im 17/21]
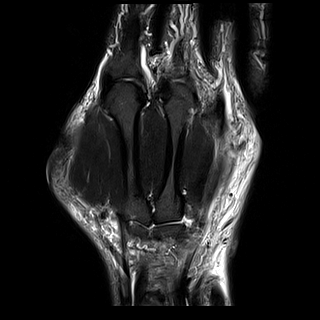
[im 21/21]
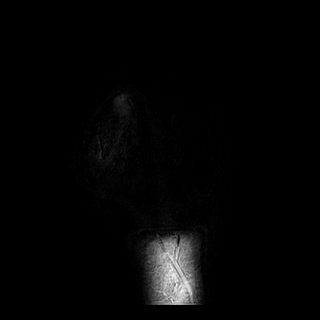

[Series 7: T1 · sagittal · left · 3.0mm · 0.50mm/px · 6 of 21 slices shown]
[im 1/21]
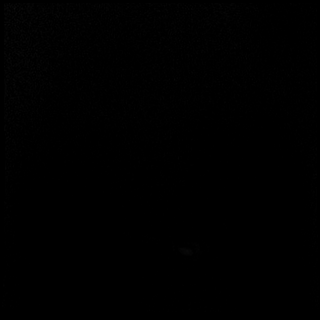
[im 5/21]
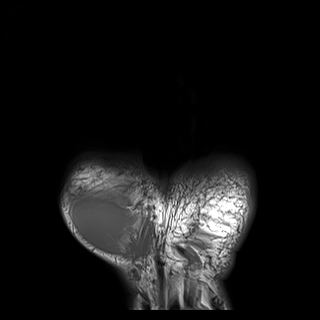
[im 9/21]
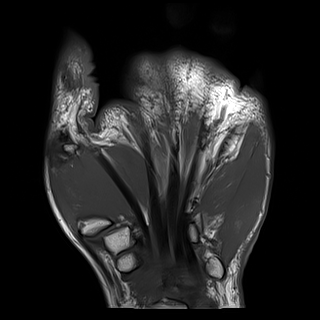
[im 13/21]
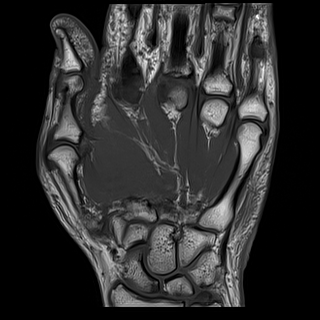
[im 17/21]
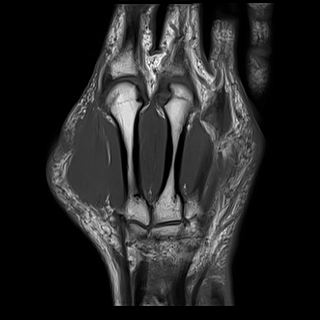
[im 21/21]
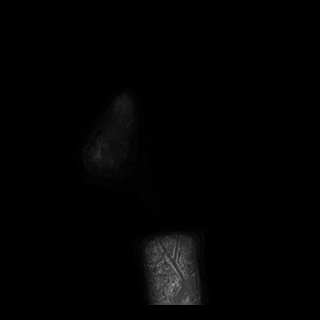

[Series 11: T1 fat-sat post-contrast · sagittal · left · 3.0mm · 0.50mm/px · 6 of 21 slices shown (1 of 2)]
[im 1/21]
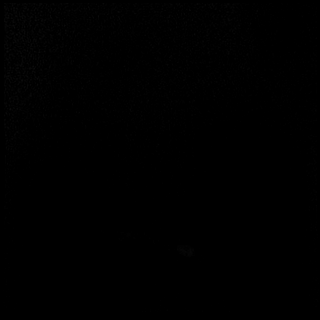
[im 5/21]
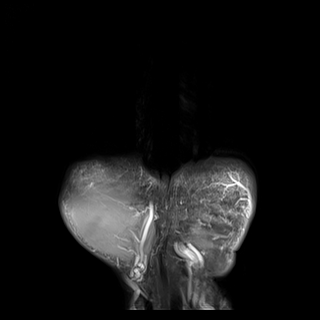
[im 9/21]
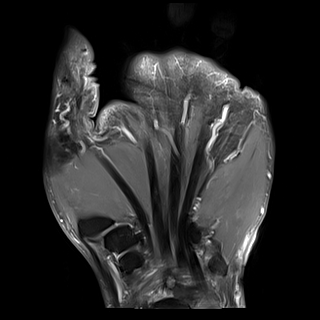
[im 13/21]
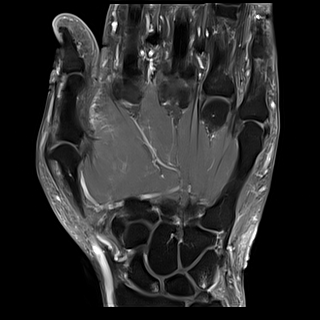
[im 17/21]
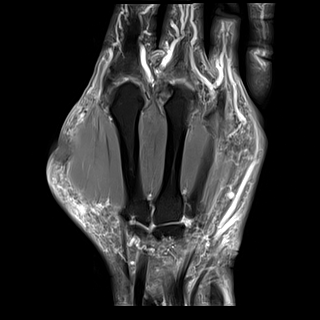
[im 21/21]
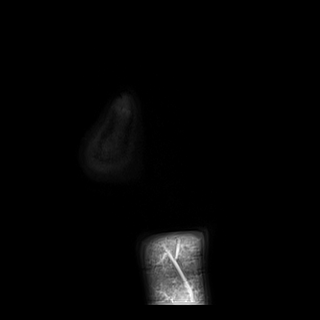

[Series 12: T1 fat-sat post-contrast · coronal · left · 3.0mm · 0.62mm/px · 11 of 40 slices shown (2 of 2)]
[im 1/40]
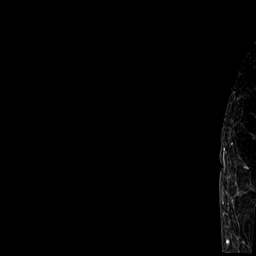
[im 4/40]
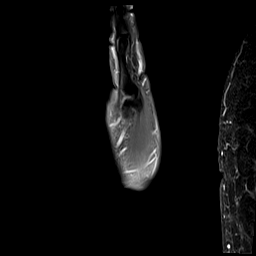
[im 8/40]
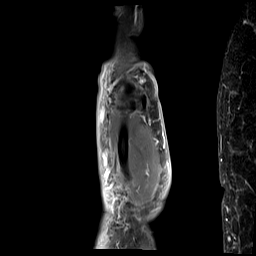
[im 12/40]
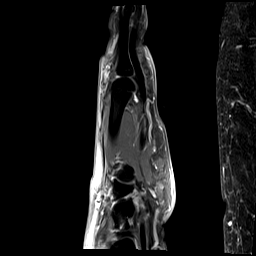
[im 16/40]
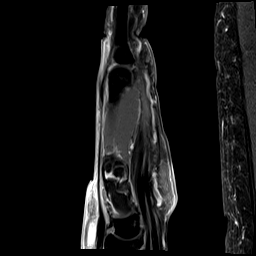
[im 20/40]
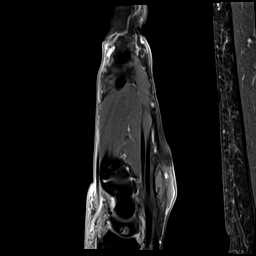
[im 24/40]
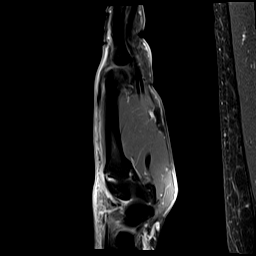
[im 28/40]
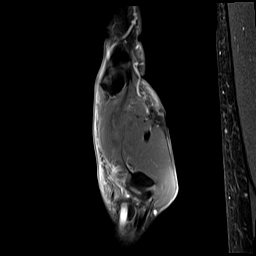
[im 32/40]
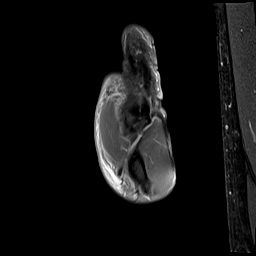
[im 36/40]
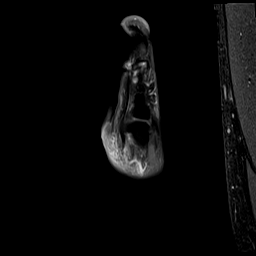
[im 40/40]
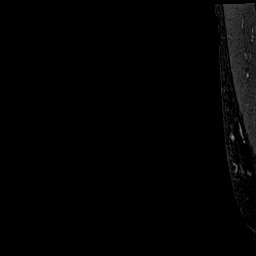

[40 of 40 positions shown; findings below may reference images not displayed]

FINDINGS: Bones/Joint/Cartilage

Cystic changes are seen within the scaphoid capitate, lunate, and
inferior pole of the triquetrum. No areas of bony destruction,
marrow edema pathologic marrow infiltration. No areas of abnormal
enhancement. No joint effusions are seen. The joint spaces appear to
be well maintained.

Ligaments

The intrinsic of the hand are intact.

Muscles and Tendons

The muscles are normal appearance without evidence of edema or
atrophy. The flexor and extensor tendons are intact.

Soft tissues

There is diffuse dorsal subcutaneous edema and skin thickening. No
enhancing loculated fluid collection however is seen. No sinus tract
or large area of ulceration.
IMPRESSION: 1. Findings suggestive dorsal soft tissue cellulitis. No abscess or
sinus tract.
2. No evidence osteomyelitis.
3. Cystic degenerative changes seen within the carpals as described
above.

## 2020-09-14 ENCOUNTER — Emergency Department
Admission: EM | Admit: 2020-09-14 | Discharge: 2020-09-14 | Disposition: A | Discharge status: Home / Self Care | Payer: No Typology Code available for payment source | Attending: Emergency Medicine | Admitting: Emergency Medicine

## 2020-09-14 ENCOUNTER — Other Ambulatory Visit: Payer: Self-pay

## 2020-09-14 DIAGNOSIS — Z79899 Other long term (current) drug therapy: Secondary | ICD-10-CM | POA: Insufficient documentation

## 2020-09-14 DIAGNOSIS — T464X5A Adverse effect of angiotensin-converting-enzyme inhibitors, initial encounter: Secondary | ICD-10-CM | POA: Diagnosis not present

## 2020-09-14 DIAGNOSIS — T783XXA Angioneurotic edema, initial encounter: Secondary | ICD-10-CM

## 2020-09-14 DIAGNOSIS — Z87891 Personal history of nicotine dependence: Secondary | ICD-10-CM | POA: Diagnosis not present

## 2020-09-14 DIAGNOSIS — I10 Essential (primary) hypertension: Secondary | ICD-10-CM | POA: Insufficient documentation

## 2020-09-14 DIAGNOSIS — T7840XA Allergy, unspecified, initial encounter: Secondary | ICD-10-CM | POA: Insufficient documentation

## 2020-09-14 MED ORDER — SODIUM CHLORIDE 0.9 % IV BOLUS
1000.0000 mL | Freq: Once | INTRAVENOUS | Status: AC
  Administered 2020-09-14: 1000 mL via INTRAVENOUS

## 2020-09-14 MED ORDER — DIPHENHYDRAMINE HCL 50 MG/ML IJ SOLN
50.0000 mg | Freq: Once | INTRAMUSCULAR | Status: AC
  Administered 2020-09-14: 50 mg via INTRAVENOUS
  Filled 2020-09-14: qty 1

## 2020-09-14 MED ORDER — DEXAMETHASONE SODIUM PHOSPHATE 10 MG/ML IJ SOLN
10.0000 mg | Freq: Once | INTRAMUSCULAR | Status: AC
  Administered 2020-09-14: 10 mg via INTRAVENOUS
  Filled 2020-09-14: qty 1

## 2020-09-14 NOTE — ED Provider Notes (Signed)
Patient care assumed at 3 PM.  The patient appears in no acute distress.  He feels like his tongue swelling is progressively improving.  He has normal phonation and speech.  No difficulty swallowing or breathing.  No specific allergen triggers.  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  He has already been prescribed an outpatient course of steroids.  Will advise Benadryl and Pepcid in the event of possible concomitant allergic reaction, and he knows to stop lisinopril.  This will be added to his allergy list.  He has an appointment with his practitioner later this week for discussion of hypertensive alternatives.  Stable for discharge.   Shaune Pollack, MD 09/14/20 [phone]

## 2020-09-14 NOTE — Discharge Instructions (Addendum)
As we discussed please discontinue your lisinopril indefinitely.  Please follow-up with your doctor for recheck/reevaluation.  Return to the emergency department for any increase in swelling, any trouble breathing, or any other symptom personally concerning to yourself.  I'd recommend taking: Benadryl 25-50 mg every 6 hours for 2-3 days Take Pepcid 20 mg daily twice a day for 2-3 days

## 2020-09-14 NOTE — ED Triage Notes (Signed)
Pt c/o having lip, face and tongue swelling that started yesterday states he got a prescription for prednisone and took the first dose today but took another dose of his lisinopril last night and this morning and the swelling has returned.

## 2020-09-14 NOTE — ED Provider Notes (Signed)
[facility] Emergency Department Provider Note  Time seen: 2:03 PM  I have reviewed the triage vital signs and the nursing notes.   HISTORY  Chief Complaint Allergic Reaction and Angioedema   HPI Kirk Landry is a 50 y.o. male with a past medical history of hypertension who presents to the emergency department for left-sided facial and tongue swelling.  According to the patient he has been taking lisinopril for nearly 10 years including last night and this morning.  Patient states yesterday he noted some swelling of the left side of his face, states when he woke up this morning it was still present so he called his doctor after taking his medications.  His doctor told him not to take that medication anymore in the future and called him in a steroid which he took.  Patient states he thought the swelling was going down but then noted that the left side of his tongue began to swell so he came to the emergency department for evaluation.  Patient denies any itching or hives denies any trouble breathing or swallowing.  Past Medical History:  Diagnosis Date   Hypertension     Patient Active Problem List   Diagnosis Date Noted   Cellulitis of left hand 01/19/2019    History reviewed. No pertinent surgical history.  Prior to Admission medications   Medication Sig Start Date End Date Taking? Authorizing Provider  acetaminophen (TYLENOL) 325 MG tablet Take 2 tablets (650 mg total) by mouth every 6 (six) hours as needed for mild pain (or Fever >/= 101). 01/20/19   Landry, Ava, DO  famotidine (PEPCID) 20 MG tablet Take 20 mg by mouth 2 (two) times daily as needed. 12/09/18   [provider]  levothyroxine (SYNTHROID) 50 MCG tablet Take 50 mcg by mouth every morning. 12/09/18   [provider]  lisinopril (ZESTRIL) 20 MG tablet Take 20 mg by mouth 2 (two) times daily. 12/09/18   [provider]  sildenafil (VIAGRA) 100 MG tablet Take 50 mg by mouth  daily as needed. 01/12/19   [provider]    No Known Allergies  No family history on file.  Social History Social History   Tobacco Use   Smoking status: Former    Pack years: 0.00   Smokeless tobacco: Never  Vaping Use   Vaping Use: Never used  Substance Use Topics   Alcohol use: Yes    Review of Systems Constitutional: Negative for fever. ENT: Left tongue swelling. Cardiovascular: Negative for chest pain. Respiratory: Negative for shortness of breath. Gastrointestinal: Negative for abdominal pain, vomiting  Musculoskeletal: Negative for musculoskeletal complaints Neurological: Negative for headache All other ROS negative  ____________________________________________   PHYSICAL EXAM:  VITAL SIGNS: ED Triage Vitals  Enc Vitals Group     BP 09/14/20 1339 135/88     Pulse Rate 09/14/20 1339 91     Resp 09/14/20 1339 18     Temp 09/14/20 1345 97.8 F (36.6 C)     Temp Source 09/14/20 1339 Oral     SpO2 09/14/20 1339 100 %     Weight 09/14/20 1339 255 lb (115.7 kg)     Height 09/14/20 1339 6\' 5"  (1.956 m)     Head Circumference --      Peak Flow --      Pain Score 09/14/20 1339 0     Pain Loc --      Pain Edu? --      Excl. in GC? --  Constitutional: Alert and oriented. Well appearing and in no distress. Eyes: Normal exam ENT      Head: Mild left facial swelling mild left lip swelling      Mouth/Throat: Mucous membranes are moist.  Mild swelling left side of the tongue.  Mild swelling of the left face. Cardiovascular: Normal rate, regular rhythm.  Respiratory: Normal respiratory effort without tachypnea nor retractions. Breath sounds are clear  Gastrointestinal: Soft and nontender. Musculoskeletal: Nontender with normal range of motion in all extremities. Neurologic:  Normal speech and language. No gross focal neurologic deficits  Skin:  Skin is warm, dry and intact.  Psychiatric: Mood and affect are normal.     INITIAL IMPRESSION /  ASSESSMENT AND PLAN / ED COURSE  Pertinent labs & imaging results that were available during my care of the patient were reviewed by me and considered in my medical decision making (see chart for details).   Patient presents to the emergency department for swelling of left side of his face and tongue.  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  Currently mild.  Oropharynx is widely patent, midline uvula.  We will treat with dexamethasone, diphenhydramine, fluids and continue to closely monitor.  Patient agreeable to plan of care.  Patient states it feels like the swelling is coming down on its own as well.  ----------------------------------------- 3:13 PM on 09/14/2020 ----------------------------------------- I checked on the patient, he states he is feeling better he feels like the swelling continues to decrease.  Patient's tongue does appear less swollen, remains mildly swollen.  We will continue to monitor the patient anticipate likely discharge home with PCP follow-up.  Discussed with the patient again to discontinue lisinopril.  Kirk Landry was evaluated in Emergency Department on 09/14/2020 for the symptoms described in the history of present illness. He was evaluated in the context of the global COVID-19 pandemic, which necessitated consideration that the patient might be at risk for infection with the SARS-CoV-2 virus that causes COVID-19. Institutional protocols and algorithms that pertain to the evaluation of patients at risk for COVID-19 are in a state of rapid change based on information released by regulatory bodies including the CDC and federal and state organizations. These policies and algorithms were followed during the patient's care in the ED.  ____________________________________________   FINAL CLINICAL IMPRESSION(S) / ED DIAGNOSES  ACE inhibitor induced angioedema   Minna Antis, MD 09/14/20 1514

## 2023-01-01 LAB — COLOGUARD: COLOGUARD: NEGATIVE

## 2023-06-23 LAB — LAB REPORT - SCANNED: EGFR: 65

## 2023-11-15 NOTE — Progress Notes (Signed)
 Patient ID: Kirk Landry, male   DOB: 1970/09/16, 53 y.o.   MRN: [id number]  Chief Complaint: Known umbilical hernia, question of left inguinal hernia, epigastric pain.  History of Present Illness Kirk Landry is a 53 y.o. male with primary complaint appears to be episodes of epigastric pressure pain that prevents him from completing a usual meal he enjoys.  He has noted that after a meal of prime rib, and the like, that he develops a mid medial epigastric pressure that prevents him from completing the meal and enjoying the remainder of his evening.  Sometimes this occurs later at night but typically after the main meal of the day in the evening.  No known fevers or chills, pain apparently resolves with time. He has a known umbilical hernia with a bulge, he describes it as golf ball size at times, typically reduces spontaneously.  Denies any vomiting, nausea, fevers or chills.  Reports his bowel movements are regular and consistent 1-2 daily. He has also noted some left lower quadrant pressure, at times associated with lifting, but other times seems more closely related to his bowel movements and having changes with gas and sensations of gas associated with his bowel activity.  He denies any known bulge in either groin, and does a significant amount of weight lifting and workout.  Past Medical History Past Medical History:  Diagnosis Date   Hypertension       History reviewed. No pertinent surgical history.  Allergies  Allergen Reactions   Ace Inhibitors Swelling    Angioedema from Lisinopril  09/2020    Current Outpatient Medications  Medication Sig Dispense Refill   allopurinol (ZYLOPRIM) 100 MG tablet Take 200 mg by mouth daily.     amLODipine (NORVASC) 5 MG tablet Take 5 mg by mouth daily.     famotidine  (PEPCID ) 20 MG tablet Take 20 mg by mouth 2 (two) times daily as needed.     levothyroxine  (SYNTHROID ) 50 MCG tablet Take 50 mcg by mouth every morning.     sildenafil (VIAGRA)  100 MG tablet Take 50 mg by mouth daily as needed.     testosterone cypionate (DEPOTESTOSTERONE CYPIONATE) 200 MG/ML injection Inject 200 mg into the muscle every 14 (fourteen) days.     No current facility-administered medications for this visit.    Family History History reviewed. No pertinent family history.    Social History Social History   Tobacco Use   Smoking status: Former   Smokeless tobacco: Never  Advertising account planner   Vaping status: Never Used  Substance Use Topics   Alcohol use: Yes        ROS   Physical Exam Blood pressure (!) 142/94, pulse 62, temperature 98.3 F (36.8 C), temperature source Oral, height 6' 4 (1.93 m), weight 248 lb 12.8 oz (112.9 kg), SpO2 96%. Last Weight  Most recent update: 11/22/2023  9:02 AM    Weight  112.9 kg (248 lb 12.8 oz)             CONSTITUTIONAL: Well developed, and nourished, appropriately responsive and aware without distress.  Fit and tanned appearing individual. EYES: Sclera non-icteric.   EARS, NOSE, MOUTH AND THROAT:  The oropharynx is clear. Oral mucosa is pink and moist.     Hearing is intact to voice.  NECK: Trachea is midline, and there is no jugular venous distension.  LYMPH NODES:  Lymph nodes in the neck are not appreciated. RESPIRATORY:  Lungs are clear, and breath sounds are equal bilaterally.  Normal respiratory  effort without pathologic use of accessory muscles. CARDIOVASCULAR: Heart is regular in rate and rhythm.   Well perfused.  GI: The abdomen is there is a 1 to 1.5 cm diameter umbilical fascial defect with a strong well-defined umbilical ring, otherwise soft, nontender, and nondistended. There were no palpable masses.  I did not appreciate hepatosplenomegaly.  GU: Normal male genitalia with descended testes.  I believe there are fat filled inguinal hernias bilaterally, without appreciable definitive sac. MUSCULOSKELETAL:  Symmetrical muscle tone appreciated in all four extremities.    SKIN: Skin turgor is  normal. No pathologic skin lesions appreciated.  NEUROLOGIC:  Motor and sensation appear grossly normal.  Cranial nerves are grossly without defect. PSYCH:  Alert and oriented to person, place and time. Affect is appropriate for situation.  Data Reviewed I have personally reviewed what is currently available of the patient's imaging, recent labs and medical records.   Labs:     Latest Ref Rng & Units 01/20/2019    4:53 AM 01/19/2019    5:42 AM 01/18/2019   10:28 PM  CBC  WBC 4.0 - 10.5 K/uL 4.0 - 10.5 K/uL 3.7    3.7  5.5  6.8   Hemoglobin 13.0 - 17.0 g/dL 86.9 - 82.9 g/dL 85.6    85.7  85.0  84.8   Hematocrit 39.0 - 52.0 % 39.0 - 52.0 % 40.1    41.0  41.6  43.8   Platelets 150 - 400 K/uL 150 - 400 K/uL 159    175  178  180       Latest Ref Rng & Units 01/20/2019    4:53 AM 01/19/2019    5:42 AM 01/18/2019   10:28 PM  CMP  Glucose 70 - 99 mg/dL 899  880  95   BUN 6 - 20 mg/dL 22  24  28    Creatinine 0.61 - 1.24 mg/dL 8.62  8.81  8.52   Sodium 135 - 145 mmol/L 139  137  129   Potassium 3.5 - 5.1 mmol/L 4.0  3.9  4.3   Chloride 98 - 111 mmol/L 107  105  96   CO2 22 - 32 mmol/L 23  23  21    Calcium 8.9 - 10.3 mg/dL 8.7  8.5  8.6   Total Protein 6.5 - 8.1 g/dL   7.1   Total Bilirubin 0.3 - 1.2 mg/dL   1.1   Alkaline Phos 38 - 126 U/L   38   AST 15 - 41 U/L   35   ALT 0 - 44 U/L   54     Imaging: Radiological images personally reviewed:  None Within last 24 hrs: No results found.  Assessment     Patient Active Problem List   Diagnosis Date Noted   Abdominal pain, left lower quadrant 11/22/2023   Umbilical hernia without obstruction and without gangrene 11/22/2023   Abdominal pain, epigastric 11/22/2023   Cellulitis of left hand 01/19/2019    Plan    Will obtain right upper quadrant ultrasound to evaluate for suspicious biliary pathology, if ultrasound is negative we will proceed with HIDA scan considering fatty food provocation of epigastric pain.  If we do  confirm gallbladder is a likely source of his Peri-prandial/postprandial pain, we will proceed with robotic cholecystectomy and repair of umbilical hernia primarily.  During that laparoscopic event, would be able to evaluate the groins for identification of true inguinal hernias as opposed to fat filled canals.  The Cologuard may be adequate screening  from a colonic perspective, but questioning this left lower quadrant discomfort associated with bowel activity it may be worthwhile to consider a more complete evaluation with colonoscopy.     I personally spent a total of 45 minutes in the care of the patient today including preparing to see the patient, getting/reviewing separately obtained history, performing a medically appropriate exam/evaluation, counseling and educating, and documenting clinical information in the EHR.   These notes generated with voice recognition software. I apologize for typographical errors.  Honor Leghorn M.D., FACS 11/22/2023, 10:02 AM

## 2023-11-22 ENCOUNTER — Encounter: Payer: Self-pay | Admitting: Surgery

## 2023-11-22 ENCOUNTER — Ambulatory Visit (INDEPENDENT_AMBULATORY_CARE_PROVIDER_SITE_OTHER): Payer: Self-pay | Admitting: Surgery

## 2023-11-22 VITALS — BP 142/94 | HR 62 | Temp 98.3°F | Ht 76.0 in | Wt 248.8 lb

## 2023-11-22 DIAGNOSIS — R1013 Epigastric pain: Secondary | ICD-10-CM | POA: Diagnosis not present

## 2023-11-22 DIAGNOSIS — R1011 Right upper quadrant pain: Secondary | ICD-10-CM | POA: Diagnosis not present

## 2023-11-22 DIAGNOSIS — K429 Umbilical hernia without obstruction or gangrene: Secondary | ICD-10-CM | POA: Insufficient documentation

## 2023-11-22 DIAGNOSIS — R1032 Left lower quadrant pain: Secondary | ICD-10-CM | POA: Insufficient documentation

## 2023-11-22 NOTE — Patient Instructions (Addendum)
 Your Ultrasound is scheduled for 11/26/2023 9 am (arrive by 8:45 am) at [facility] Nothing to eat or drink after midnight.   You have requested for your Umbilical Hernia be repaired. This will be scheduled with Dr. Lane at [facility].   If you are on any injectable weight loss medication, you will need to stop taking your GLP-1 injectable (weight loss) medications 8 days before your surgery to avoid any complications with anesthesia.   Please see your (blue)pre-care sheet for information. Our surgery scheduler will call you to verify surgery date and to go over information.   You will need to arrange to be off work for 1-2 weeks but will have to have a lifting restriction of no more than 15 lbs for 6 weeks following your surgery. If you have FMLA or disability paperwork that needs filled out you may drop this off at our office or this can be faxed to [phone].     Umbilical Hernia, Adult A hernia is a bulge of tissue that pushes through an opening between muscles. An umbilical hernia happens in the abdomen, near the belly button (umbilicus). The hernia may contain tissues from the small intestine, large intestine, or fatty tissue covering the intestines (omentum). Umbilical hernias in adults tend to get worse over time, and they require surgical treatment. There are several types of umbilical hernias. You may have: A hernia located just above or below the umbilicus (indirect hernia). This is the most common type of umbilical hernia in adults. A hernia that forms through an opening formed by the umbilicus (direct hernia). A hernia that comes and goes (reducible hernia). A reducible hernia may be visible only when you strain, lift something heavy, or cough. This type of hernia can be pushed back into the abdomen (reduced). A hernia that traps abdominal tissue inside the hernia (incarcerated hernia). This type of hernia cannot be reduced. A hernia that cuts  off blood flow to the tissues inside the hernia (strangulated hernia). The tissues can start to die if this happens. This type of hernia requires emergency treatment.  What are the causes? An umbilical hernia happens when tissue inside the abdomen presses on a weak area of the abdominal muscles. What increases the risk? You may have a greater risk of this condition if you: Are obese. Have had several pregnancies. Have a buildup of fluid inside your abdomen (ascites). Have had surgery that weakens the abdominal muscles.  What are the signs or symptoms? The main symptom of this condition is a painless bulge at or near the belly button. A reducible hernia may be visible only when you strain, lift something heavy, or cough. Other symptoms may include: Dull pain. A feeling of pressure.  Symptoms of a strangulated hernia may include: Pain that gets increasingly worse. Nausea and vomiting. Pain when pressing on the hernia. Skin over the hernia becoming red or purple. Constipation. Blood in the stool.  How is this diagnosed? This condition may be diagnosed based on: A physical exam. You may be asked to cough or strain while standing. These actions increase the pressure inside your abdomen and force the hernia through the opening in your muscles. Your health care provider may try to reduce the hernia by pressing on it. Your symptoms and medical history.  How is this treated? Surgery is the only treatment for an umbilical hernia. Surgery for a strangulated hernia is done as soon as possible. If you have a small hernia that is  not incarcerated, you may need to lose weight before having surgery. Follow these instructions at home: Lose weight, if told by your health care provider. Do not try to push the hernia back in. Watch your hernia for any changes in color or size. Tell your health care provider if any changes occur. You may need to avoid activities that increase pressure on your  hernia. Do not lift anything that is heavier than 10 lb (4.5 kg) until your health care provider says that this is safe. Take over-the-counter and prescription medicines only as told by your health care provider. Keep all follow-up visits as told by your health care provider. This is important. Contact a health care provider if: Your hernia gets larger. Your hernia becomes painful. Get help right away if: You develop sudden, severe pain near the area of your hernia. You have pain as well as nausea or vomiting. You have pain and the skin over your hernia changes color. You develop a fever. This information is not intended to replace advice given to you by your health care provider. Make sure you discuss any questions you have with your health care provider. Document Released: 07/30/2015 Document Revised: 10/31/2015 Document Reviewed: 07/30/2015 Elsevier Interactive Patient Education  [employer].

## 2023-11-26 ENCOUNTER — Ambulatory Visit
Admission: RE | Admit: 2023-11-26 | Discharge: 2023-11-26 | Disposition: A | Discharge status: Home / Self Care | Source: Ambulatory Visit | Attending: Surgery | Admitting: Surgery

## 2023-11-26 DIAGNOSIS — R1011 Right upper quadrant pain: Secondary | ICD-10-CM | POA: Diagnosis present

## 2023-12-04 ENCOUNTER — Other Ambulatory Visit: Payer: Self-pay

## 2023-12-04 ENCOUNTER — Telehealth: Payer: Self-pay

## 2023-12-04 DIAGNOSIS — R1011 Right upper quadrant pain: Secondary | ICD-10-CM

## 2023-12-04 NOTE — Telephone Encounter (Signed)
-----   Message from Honor Leghorn sent at 12/03/2023  7:42 AM EDT ----- Regarding: please schedule pt for HIDA with EF, thanks. please schedule pt for HIDA with EF, thanks.

## 2023-12-04 NOTE — Telephone Encounter (Signed)
 Spoke with the patient about scheduling a HIDA scan to look at the function of his gallbladder. He is amendable to this.  The patient is scheduled for a HIDA scan at [facility] on 12/26/23 at 9:00 am. He will need to arrive there by 8:30 am he will enter in through the [facility] entrance and have nothing to eat or drink after midnight.

## 2023-12-26 ENCOUNTER — Ambulatory Visit
Admission: RE | Admit: 2023-12-26 | Discharge: 2023-12-26 | Disposition: A | Discharge status: Home / Self Care | Source: Ambulatory Visit | Attending: Surgery | Admitting: Surgery

## 2023-12-26 DIAGNOSIS — R1011 Right upper quadrant pain: Secondary | ICD-10-CM | POA: Diagnosis present

## 2023-12-26 MED ORDER — TECHNETIUM TC 99M MEBROFENIN IV KIT
5.0000 | PACK | Freq: Once | INTRAVENOUS | Status: AC | PRN
  Administered 2023-12-26: 5.43 via INTRAVENOUS
